# Patient Record
Sex: Male | Born: 1950 | Race: Black or African American | Hispanic: No | Smoking: Former smoker
Health system: Southern US, Community
[De-identification: ages and names within clinical notes are randomized; demographics above are authoritative.]

## PROBLEM LIST (undated history)

## (undated) DIAGNOSIS — N390 Urinary tract infection, site not specified: Secondary | ICD-10-CM

## (undated) DIAGNOSIS — R1310 Dysphagia, unspecified: Secondary | ICD-10-CM

## (undated) DIAGNOSIS — E875 Hyperkalemia: Secondary | ICD-10-CM

## (undated) DIAGNOSIS — J449 Chronic obstructive pulmonary disease, unspecified: Secondary | ICD-10-CM

## (undated) DIAGNOSIS — G8190 Hemiplegia, unspecified affecting unspecified side: Secondary | ICD-10-CM

## (undated) DIAGNOSIS — I639 Cerebral infarction, unspecified: Secondary | ICD-10-CM

## (undated) DIAGNOSIS — M6281 Muscle weakness (generalized): Secondary | ICD-10-CM

## (undated) DIAGNOSIS — Z931 Gastrostomy status: Secondary | ICD-10-CM

## (undated) DIAGNOSIS — F0390 Unspecified dementia without behavioral disturbance: Secondary | ICD-10-CM

## (undated) HISTORY — PX: PEG TUBE PLACEMENT: SUR1034

---

## 2007-04-07 ENCOUNTER — Ambulatory Visit: Payer: Self-pay | Admitting: *Deleted

## 2007-09-21 ENCOUNTER — Ambulatory Visit: Payer: Self-pay | Admitting: Unknown Physician Specialty

## 2008-07-14 ENCOUNTER — Ambulatory Visit: Payer: Self-pay | Admitting: Internal Medicine

## 2009-08-02 ENCOUNTER — Emergency Department (HOSPITAL_COMMUNITY): Admission: EM | Admit: 2009-08-02 | Discharge: 2009-08-02 | Payer: Self-pay | Admitting: Emergency Medicine

## 2011-01-13 ENCOUNTER — Encounter: Payer: Self-pay | Admitting: Neurology

## 2011-02-24 ENCOUNTER — Inpatient Hospital Stay (HOSPITAL_COMMUNITY)
Admission: EM | Admit: 2011-02-24 | Discharge: 2011-02-27 | DRG: 638 | Disposition: A | Discharge status: Skilled Nursing Facility | Payer: Medicare Other | Attending: Internal Medicine | Admitting: Internal Medicine

## 2011-02-24 ENCOUNTER — Emergency Department (HOSPITAL_COMMUNITY): Payer: Medicare Other

## 2011-02-24 ENCOUNTER — Inpatient Hospital Stay (HOSPITAL_COMMUNITY): Payer: Medicare Other

## 2011-02-24 DIAGNOSIS — N390 Urinary tract infection, site not specified: Secondary | ICD-10-CM | POA: Diagnosis present

## 2011-02-24 DIAGNOSIS — E872 Acidosis, unspecified: Secondary | ICD-10-CM | POA: Diagnosis present

## 2011-02-24 DIAGNOSIS — D509 Iron deficiency anemia, unspecified: Secondary | ICD-10-CM | POA: Diagnosis present

## 2011-02-24 DIAGNOSIS — I6529 Occlusion and stenosis of unspecified carotid artery: Secondary | ICD-10-CM | POA: Diagnosis present

## 2011-02-24 DIAGNOSIS — I69959 Hemiplegia and hemiparesis following unspecified cerebrovascular disease affecting unspecified side: Secondary | ICD-10-CM

## 2011-02-24 DIAGNOSIS — E870 Hyperosmolality and hypernatremia: Secondary | ICD-10-CM | POA: Diagnosis present

## 2011-02-24 DIAGNOSIS — N179 Acute kidney failure, unspecified: Secondary | ICD-10-CM | POA: Diagnosis present

## 2011-02-24 DIAGNOSIS — IMO0001 Reserved for inherently not codable concepts without codable children: Principal | ICD-10-CM | POA: Diagnosis present

## 2011-02-24 DIAGNOSIS — Z9689 Presence of other specified functional implants: Secondary | ICD-10-CM

## 2011-02-24 DIAGNOSIS — E785 Hyperlipidemia, unspecified: Secondary | ICD-10-CM | POA: Diagnosis present

## 2011-02-24 DIAGNOSIS — I9589 Other hypotension: Secondary | ICD-10-CM | POA: Diagnosis present

## 2011-02-24 DIAGNOSIS — B9689 Other specified bacterial agents as the cause of diseases classified elsewhere: Secondary | ICD-10-CM | POA: Diagnosis present

## 2011-02-24 DIAGNOSIS — E876 Hypokalemia: Secondary | ICD-10-CM | POA: Diagnosis present

## 2011-02-24 DIAGNOSIS — E861 Hypovolemia: Secondary | ICD-10-CM | POA: Diagnosis present

## 2011-02-24 DIAGNOSIS — I951 Orthostatic hypotension: Secondary | ICD-10-CM | POA: Diagnosis present

## 2011-02-24 LAB — URINE MICROSCOPIC-ADD ON

## 2011-02-24 LAB — COMPREHENSIVE METABOLIC PANEL
ALT: 13 U/L (ref 0–53)
AST: 12 U/L (ref 0–37)
Albumin: 1.4 g/dL — ABNORMAL LOW (ref 3.5–5.2)
CO2: 18 mEq/L — ABNORMAL LOW (ref 19–32)
Calcium: 6 mg/dL — CL (ref 8.4–10.5)
Creatinine, Ser: 0.91 mg/dL (ref 0.4–1.5)
GFR calc Af Amer: >60 mL/min (ref >60)
GFR calc non Af Amer: >60 mL/min (ref >60)
Sodium: 148 mEq/L — ABNORMAL HIGH (ref 135–145)
Total Protein: 3.8 g/dL — ABNORMAL LOW (ref 6.0–8.3)

## 2011-02-24 LAB — DIFFERENTIAL
Basophils Absolute: 0 10*3/uL (ref 0.0–0.1)
Basophils Relative: 0 % (ref 0–1)
Neutro Abs: 8.2 10*3/uL — ABNORMAL HIGH (ref 1.7–7.7)
Neutrophils Relative %: 76 % (ref 43–77)

## 2011-02-24 LAB — BLOOD GAS, ARTERIAL
Acid-base deficit: 0.5 mmol/L (ref 0.0–2.0)
Bicarbonate: 23.9 mEq/L (ref 20.0–24.0)
TCO2: 22.3 mmol/L (ref 0–100)
pCO2 arterial: 41.1 mmHg (ref 35.0–45.0)
pH, Arterial: 7.382 (ref 7.350–7.450)
pO2, Arterial: 69.6 mmHg — ABNORMAL LOW (ref 80.0–100.0)

## 2011-02-24 LAB — PROTIME-INR: Prothrombin Time: 16.9 seconds — ABNORMAL HIGH (ref 11.6–15.2)

## 2011-02-24 LAB — URINALYSIS, ROUTINE W REFLEX MICROSCOPIC
Glucose, UA: >1000 mg/dL — AB
Ketones, ur: 15 mg/dL — AB
Leukocytes, UA: NEGATIVE
Nitrite: POSITIVE — AB
pH: 5 (ref 5.0–8.0)

## 2011-02-24 LAB — CBC
HCT: 29.8 % — ABNORMAL LOW (ref 39.0–52.0)
MCH: 28.3 pg (ref 26.0–34.0)
MCHC: 32.9 g/dL (ref 30.0–36.0)
MCV: 86.1 fL (ref 78.0–100.0)
RBC: 3.46 MIL/uL — ABNORMAL LOW (ref 4.22–5.81)
WBC: 10.7 10*3/uL — ABNORMAL HIGH (ref 4.0–10.5)

## 2011-02-24 LAB — GLUCOSE, CAPILLARY
Glucose-Capillary: 510 mg/dL — ABNORMAL HIGH (ref 70–99)
Glucose-Capillary: 263 mg/dL — ABNORMAL HIGH (ref 70–99)

## 2011-02-24 LAB — POCT CARDIAC MARKERS
Myoglobin, poc: 487 ng/mL (ref 12–200)
Troponin i, poc: <0.05 ng/mL (ref 0.00–0.09)

## 2011-02-25 ENCOUNTER — Inpatient Hospital Stay (HOSPITAL_COMMUNITY): Payer: Medicare Other

## 2011-02-25 DIAGNOSIS — R55 Syncope and collapse: Secondary | ICD-10-CM

## 2011-02-25 LAB — COMPREHENSIVE METABOLIC PANEL
ALT: 18 U/L (ref 0–53)
AST: 17 U/L (ref 0–37)
Albumin: 2.1 g/dL — ABNORMAL LOW (ref 3.5–5.2)
Alkaline Phosphatase: 100 U/L (ref 39–117)
Chloride: 112 mEq/L (ref 96–112)
GFR calc Af Amer: >60 mL/min (ref >60)
Potassium: 3.9 mEq/L (ref 3.5–5.1)
Sodium: 145 mEq/L (ref 135–145)
Total Bilirubin: 0.7 mg/dL (ref 0.3–1.2)

## 2011-02-25 LAB — GLUCOSE, CAPILLARY
Glucose-Capillary: 255 mg/dL — ABNORMAL HIGH (ref 70–99)
Glucose-Capillary: 125 mg/dL — ABNORMAL HIGH (ref 70–99)
Glucose-Capillary: 226 mg/dL — ABNORMAL HIGH (ref 70–99)

## 2011-02-25 LAB — CBC
MCV: 86.7 fL (ref 78.0–100.0)
Platelets: 255 10*3/uL (ref 150–400)
RBC: 4.07 MIL/uL — ABNORMAL LOW (ref 4.22–5.81)
WBC: 14.2 10*3/uL — ABNORMAL HIGH (ref 4.0–10.5)

## 2011-02-25 LAB — CARDIAC PANEL(CRET KIN+CKTOT+MB+TROPI)
CK, MB: 4.3 ng/mL — ABNORMAL HIGH (ref 0.3–4.0)
Relative Index: 1.9 (ref 0.0–2.5)
Total CK: 203 U/L (ref 7–232)
Troponin I: 0.01 ng/mL (ref 0.00–0.06)

## 2011-02-25 LAB — DIFFERENTIAL
Basophils Relative: 0 % (ref 0–1)
Eosinophils Absolute: 0 10*3/uL (ref 0.0–0.7)
Lymphs Abs: 3.2 10*3/uL (ref 0.7–4.0)
Neutrophils Relative %: 65 % (ref 43–77)

## 2011-02-25 LAB — HEMOGLOBIN A1C
Hgb A1c MFr Bld: 14.7 % — ABNORMAL HIGH (ref <5.7)
Mean Plasma Glucose: 375 mg/dL — ABNORMAL HIGH (ref <117)

## 2011-02-25 NOTE — H&P (Signed)
NAMEWILFRID, [name]                 ACCOUNT NO.:  [id number]  MEDICAL RECORD NO.:  [id number]           PATIENT TYPE:  E  LOCATION:  [facility]                          FACILITY:  [facility]  PHYSICIAN:  Talmage Nap, MD  DATE OF BIRTH:  1951/06/13  DATE OF ADMISSION:  02/24/2011 DATE OF DISCHARGE:  LH                             HISTORY & PHYSICAL   PRIMARY CARE PHYSICIAN:  Dr. Lyndon Graham.  CARDIOLOGIST:  Mario Iba, MD  CHIEF COMPLAINT:  "I passed out using the bathroom."  The patient is not a very good historian.  Family member not available to give additional history.  The patient is a 60 year old African American male with history of diabetes mellitus, CVA with left hemiplegia, was seen by me in the emergency room with history of having passed out using the bathroom.  The patient, however, denies any premonitory symptoms prior to the onset of passing out.  He denied any history of chest pain.  He denied any history of shortness of breath.  He denied any nausea or vomiting.  He denied any systemic symptoms.  No fever, no chills or rigor.  He also denied any slurred speech.  He said he passed out transiently hitting his head on the floor and thereafter regained consciousness.  He denied any history of urinary or fecal incontinence. He was, however, brought to the emergency room by family members for evaluation.  PAST MEDICAL HISTORY:  Positive for diabetes mellitus and CVA.  PAST SURGICAL HISTORY:  Unknown.  PREADMISSION MEDICATIONS:  Include 1. Bisoprolol/hydrochlorothiazide 5/6.25 one p.o. daily. 2. Metformin 1000 mg p.o. daily. 3. Metoprolol tartrate 25 mg p.o. daily. 4  Simvastatin 40 mg p.o. daily.  ALLERGIES:  HE HAS NO KNOWN ALLERGIES.  SOCIAL HISTORY:  The patient smokes about a pack of cigarettes in 3 days and he has been doing that for the past 30 years.  Denies any history of alcohol use.  FAMILY HISTORY:  The patient is not aware of any systemic  illness  in the family.  REVIEW OF SYSTEMS:  The patient denies any history of headaches.  No blurred vision.  He denies any chest pain or shortness of breath.  No cough.  No abdominal discomfort.  No diarrhea or hematochezia.  No dysuria or hematuria.  No swelling of the lower extremity.  No intolerance to heat or cold, and no neuropsychiatric disorder.  PHYSICAL EXAMINATION:  GENERAL:  Middle-aged man, very dehydrated and confused, not in any respiratory distress. INITIAL VITAL SIGNS:  Blood pressure was 74/56 and after IV bolus of normal saline 115/69, pulse 96, respiratory rate 20, temperature is 98.6. HEENT:  Blind right eye, but left pupil is reactive to light. NECK:  No jugular venous distention.  No carotid bruit.  No lymphadenopathy. CHEST:  Clear to auscultation. HEART:  Sounds are 1 and 2. ABDOMEN:  Soft, nontender.  Liver, spleen, kidney are not palpable. Bowel sounds are positive. EXTREMITIES:  Showed no pedal edema. NEUROLOGIC:  Showed left hemiplegia. SKIN:  Showed markedly decreased turgor with abrasion on the face and forehead.  LABORATORY DATA:  Urine microscopy  done on the patient showed urine wbc 25-50, bacteria many, and urine microscopy was positive for urine nitrite, negative for leukocyte esterase.  Arterial blood gas showed pH of 7.38, pCO2 of 41, pO2 of 69.6 with a bicarb of 23.9.  Chemistry showed sodium of 148, potassium of 3.0, chloride of 124, bicarb is 18, glucose is 345, BUN is 18, creatinine 0.91.  LFTs normal.  Hematological indices showed WBC of 10.7, hemoglobin of 9.8, hematocrit of 29.8, MCV 86.12, platelet count of 216,000, neutrophils 76%, absolute granulocyte count is 8.6.  Capillary glucose is 510.  Coagulation profile showed PT 16.9, INR 1.35 and APTT of 51.  EKG showed normal sinus rhythm with a rate of 87 beats per minute and Q-wave.  No acute ST-wave change noted, and chest x-ray showed left retrocardiac opacification which  could reflect atelectasis or airspace disease.  There is bilateral peribronchial thickening which may represent changes of acute infection or chronic interstitial disease or may be smoking related and there is left 9th posterior rib fracture of uncertain chronicity.  First set of cardiac enzymes troponin-I less than 0.05.   CT of the brain not ordered.  ADMITTING IMPRESSION: 1. Syncope. 2. Nonketotic hyper-osmolar hyperglycemia. 3. Dehydration. 4. Urinary tract infection. 5. Hypokalemia. 6. Blind right eye. 7. Cerebrovascular accident with left hemiplegia. 8. Anemia  PLAN:  Plan is to admit the patient to telemetry.  The patient will be  rehydrated with normal saline with 20 mEq of KCl to go at rate of 250 mL an hour.  He will be on Accu-Cheks q.i.d. with a.c. h.s. with regular insulin sliding scale ( moderate scale).  Other medication to be given to the patient will include aspirin 81 mg p.o. daily, Zocor 20 mg p.o. daily, Rocephin 1 g IV q. 24 for the treatment of UTI, Protonix 40 mg IV q. 12 for GI prophylaxis, and Lovenox 40 mg subcu q. 24 for DVT prophylaxis. Further labs to be ordered on this patient will include cardiac enzymes q.6 x3, urine culture and blood culture before starting IV antibiotics, hemoglobin A1c, CBCD, CMP and magnesium will be repeated in a.m.  Imaging studies to be ordered will include CT brain without contrast, carotid duplex, 2-D echo and EEG to rule out seizures.  The patient will be reevaluated with lab and imaging studies and he will be followed on a daily basis.     Talmage Nap, MD     CN/MEDQ  D:  02/24/2011  T:  02/24/2011  Job:  [id number]  Electronically Signed by Talmage Nap  on 02/25/2011 12:49:49 AM

## 2011-02-26 LAB — DIFFERENTIAL
Basophils Absolute: 0 10*3/uL (ref 0.0–0.1)
Basophils Relative: 0 % (ref 0–1)
Eosinophils Absolute: 0.2 10*3/uL (ref 0.0–0.7)
Neutro Abs: 6.5 10*3/uL (ref 1.7–7.7)
Neutrophils Relative %: 60 % (ref 43–77)

## 2011-02-26 LAB — FOLATE: Folate: >20 ng/mL

## 2011-02-26 LAB — VITAMIN B12: Vitamin B-12: 1105 pg/mL — ABNORMAL HIGH (ref 211–911)

## 2011-02-26 LAB — GLUCOSE, CAPILLARY
Glucose-Capillary: 93 mg/dL (ref 70–99)
Glucose-Capillary: 354 mg/dL — ABNORMAL HIGH (ref 70–99)
Glucose-Capillary: 205 mg/dL — ABNORMAL HIGH (ref 70–99)

## 2011-02-26 LAB — BASIC METABOLIC PANEL
CO2: 22 mEq/L (ref 19–32)
Calcium: 8.3 mg/dL — ABNORMAL LOW (ref 8.4–10.5)
Chloride: 112 mEq/L (ref 96–112)
GFR calc Af Amer: >60 mL/min (ref >60)
Sodium: 138 mEq/L (ref 135–145)

## 2011-02-26 LAB — URINE CULTURE

## 2011-02-26 LAB — CBC
Hemoglobin: 10.7 g/dL — ABNORMAL LOW (ref 13.0–17.0)
MCHC: 33.2 g/dL (ref 30.0–36.0)
Platelets: 265 10*3/uL (ref 150–400)
RBC: 3.78 MIL/uL — ABNORMAL LOW (ref 4.22–5.81)

## 2011-02-26 LAB — IRON AND TIBC: UIBC: 159 ug/dL

## 2011-02-26 LAB — T4, FREE: Free T4: 0.91 ng/dL (ref 0.80–1.80)

## 2011-02-27 LAB — GLUCOSE, CAPILLARY: Glucose-Capillary: 93 mg/dL (ref 70–99)

## 2011-02-27 LAB — LIPID PANEL
HDL: 52 mg/dL (ref >39)
Total CHOL/HDL Ratio: 2.4 RATIO
Triglycerides: 89 mg/dL (ref <150)
VLDL: 18 mg/dL (ref 0–40)

## 2011-02-28 NOTE — Discharge Summary (Signed)
NAMECAROLOS, [name]                 ACCOUNT NO.:  [id number]  MEDICAL RECORD NO.:  [id number]           PATIENT TYPE:  I  LOCATION:  A214                          FACILITY:  [facility]  PHYSICIAN:  Elliot Cousin, M.D.    DATE OF BIRTH:  17-May-1951  DATE OF ADMISSION:  02/24/2011 DATE OF DISCHARGE:  03/07/2012LH                         DISCHARGE SUMMARY-REFERRING   DISCHARGE DIAGNOSES: 1. Syncope.  Etiology multifactorial including hypotension,     hypovolemia, urinary tract infection, and uncontrolled type 2     diabetes mellitus. 2. Klebsiella oxytoca urinary tract infection. 3. Hypotension and hypovolemia, resolved with IV fluids. 4. History of hypertension. 5. History of multiple strokes, most notably history of right brain     stroke with left-sided hemiparesis. 6. Right internal carotid artery stenosis per ultrasound. 7. Uncontrolled type 2 diabetes mellitus.  The patient's hemoglobin     A1c was 14.7. 8. Mild iron deficiency anemia.  The patient's anemia panel revealed a     total iron of 25, TIBC of 184, percent saturation of 14, vitamin     B12 of 1105, folate of greater than 20, and ferritin of 188. 9. Diastolic dysfunction per 2-D echocardiogram on February 25, 2011. 10.History of penile implant.  The patient will follow up with     urologist, Dr. Jerre Simon, to have it removed electively. 11.Metabolic acidosis, resolved. 12.Chronic maxillary sinusitis per CT scan of the head. 13.Dislocated right lens per CT scan of the head. 14.History of coronary artery disease. 15.Hyperlipidemia.  The patient's fasting lipid profile revealed a     total cholesterol of 127, triglycerides of 89, HDL cholesterol of     52, and LDL cholesterol of 57. 16.Hypernatremia. 17.Hypokalemia.  MEDICATIONS: 1. Ceftin 500 mg b.i.d. for 7 more days. 2. Plavix 75 mg daily (new medication). 3. Glipizide 5 mg half a tablet twice daily (new medication). 4. Metformin 500 mg b.i.d. 5. Lantus insulin 10 units  subcu b.i.d. (new medication). 6. Sliding scale NovoLog sensitive scale before breakfast, lunch, and     dinner (new medication). 7. Lopressor 25 mg b.i.d. 8. Simvastatin 40 mg q.h.s. 9. Multivitamin with iron once daily. 10.Tylenol 650 mg p.o. every 4 hours p.r.n. pain and fever.  DISCHARGE DISPOSITION:  The patient was discharged to [facility] in improved and stable condition on February 27, 2011.  CONSULTATIONS:  Redge Gainer, MD  PROCEDURES PERFORMED: 1. Carotid ultrasound on February 25, 2011.  The results revealed right     internal carotid artery is occluded.  The left carotid artery has     mural thickening, small plaques, but no evidence of left internal     carotid artery stenosis. 2. The 2-D echocardiogram on February 25, 2011.  The results revealed     left ventricular size is normal.  Wall thickness was increased in     the pattern of mild LVH.  Systolic function was normal.  Ejection     fraction was in the range of 50-55%.  Probable hypokinesis and     scarring of the basal mid inferior septal myocardium.  Grade 1  diastolic dysfunction.  Trivial regurgitation of the mitral valve. 3. CT scan of the head on February 25, 2011.  The results revealed     extensive remote/chronic ischemic changes bilaterally.  Right     greater than left ischemic changes.  History of prior right MCA     territory stroke.  No definite evidence of acute intracranial     abnormality.  The lens of the right orbit appears inferiorly and     posteriorly dislocated.  Chronic left maxillary sinusitis.  HISTORY OF PRESENT ILLNESS:  The patient is a 60 year old man with a past medical history significant for previous strokes, left-sided hemiparesis, type 2 diabetes mellitus, and coronary artery disease.  He presented to the emergency department on February 24, 2011, after passing out at home.  According to his friend and caretaker, Ms. Jonathon Resides, the patient was  being bathed.  During the bath, he slumped over. Ms. Donnetta Hail son was able to lift the patient onto the toilet.  He was unconscious for a few seconds before becoming more alert.  During the initial evaluation, the patient was noted to be hypotensive with a blood pressure of 74/56.  He was afebrile.  His heart rate was within normal limits.  His EKG revealed normal sinus rhythm with no ST or T-wave abnormalities and a heart rate of 87 beats per minute.  The CT scan of his head revealed chronic ischemic changes and  previous infarcts.  His chest x-ray revealed bilateral peribronchial thickening and left retrocardiac opacity either atelectasis or airspace disease. It also revealed left 9th posterior rib fracture of uncertain chronicity.  He was admitted for further evaluation and management.  HOSPITAL COURSE: 1. SYNCOPE.  The patient was aggressively hydrated in the emergency     department.  IV fluid volume repletion continued with normal saline     with potassium chloride added.  His urinalysis was consistent with     infection and therefore Rocephin was started at 1 g IV daily.  For     further evaluation, a number of studies were ordered including a CT     scan of the head, cardiac enzymes, carotid Doppler duplex, 2-D     echocardiogram, and thyroid function panel.  The CT scan of the     head revealed chronic ischemic changes and old infarcts primarily     on the right consistent with his history of right brain stroke and     left-sided hemiparesis.  The carotid ultrasound revealed right ICA     occlusion which was most likely remote and likely the potential     cause of his right brain stroke in the past.  His 2-D     echocardiogram revealed preserved LV function, although there was     evidence of grade 1 diastolic dysfunction and scarring of the basal     mid inferior septal myocardium.  The patient did relate that he had     a history of a heart attack.  All of his cardiac enzymes  were     within normal limits and therefore he ruled out for myocardial     infarction.  His TSH was within normal limits at 1.3 and his free     T4 was within normal limits at 0.91.  An ABG was ordered as well     and it revealed a pH of 7.38, pCO2 of 41, and pO2 of 70.  Following     supportive treatment, the patient improved  clinically.  There was     no evidence of syncope or worsening neurological findings based on     the history I obtained from his family.  He was evaluated by the     physical therapist.  She recommended skilled nursing facility     placement for weakness and his decreased ability to transfer.  His     family was in agreement.  Therefore, the patient was discharged to     a skilled nursing facility in [place].      Of note, due to the extensiveness of his cerebrovascular disease,     aspirin was discontinued and Plavix was started.  2. DEHYDRATION, ACUTE RENAL FAILURE, HYPERNATREMIA, AND HYPOKALEMIA.     The patient's BUN was 18, his creatinine was 0.91, his sodium was     148, and his potassium was 3.0 on admission.  His CO2 was 18.  He     was repleted with normal saline with potassium chloride added.  He     was also given potassium chloride supplementation orally.  A blood     magnesium level was assessed and it was within normal limits at     1.8.  At the time of hospital discharge, his serum sodium improved     to 138, his potassium improved to 4.1, and his CO2 improved to 22. 3. UNCONTROLLED TYPE 2 DIABETES MELLITUS.  The patient's venous     glucose was 345 on admission.  He was started on sliding scale     NovoLog and Lantus insulin.  Metformin was held temporarily.  The     Lantus and sliding scale NovoLog were both titrated upward due to     uncontrolled blood sugars.  Upon discharge, metformin was     restarted.  Glipizide was added at 5 mg daily.  Although he had not     been treated with insulin in the past, he was discharged home on      Lantus and sliding scale NovoLog.  His hemoglobin A1c was noted to     be 14.7, indicating very poor control. 4. HISTORY OF HYPERTENSION.  Metoprolol, bisoprolol, and     hydrochlorothiazide were all discontinued secondary to hypotension.     Following volume repletion, his blood pressure increased.     Therefore, metoprolol was restarted at 25 mg b.i.d.  Bisoprolol and     hydrochlorothiazide were discontinued. 5. Hyperlipidemia.  The patient was maintained on Zocor.  The results     of his fasting lipid panel were dictated above. 6. NORMOCYTIC ANEMIA.  The patient's hemoglobin ranged from 9.8 to     11.8.  An anemia panel was ordered.  The results were dictated     above.  He was discharged to the nursing facility on a     multivitamin with iron. 7. URINARY TRACT INFECTION.  The patient was started on Rocephin     empirically.  A urine culture was ordered.  It grew out greater     than 100,000 colonies of Klebsiella, sensitive to Rocephin.  He     received a total of 3 doses.  He was discharged to the skilled     nursing facility on 7 more days of Ceftin. 8. History of penile implant.  The patient desired having the penile     implant which was inserted in 1998, removed   Dr. Jerre Simon     was consulted.  The plan is for Dr. Jerre Simon to  try to obtain     surgical records from the hospital in which the patient had the     implant inserted.  He will follow up with him in 2 weeks in his     office.  The patient should follow up with Dr. Jerre Simon on March     20th, Tuesday, at 3 p.m.  SUBJECTIVE:  The patient has no complaints of chest pain, shortness of breath, or headache.  OBJECTIVE:  VITAL SIGNS:  Temperature 98.8, pulse 80, respiratory rate 18, blood pressure 158/90, oxygen saturation 97% on room air, capillary blood glucose 243. LUNGS:  Clear to auscultation bilaterally. HEART:  S1-S2 with a soft systolic murmur. ABDOMEN:  Positive bowel sounds, soft, nontender, nondistended.   No hepatosplenomegaly.  No masses palpated. GU AND RECTAL:  Deferred. EXTREMITIES:  Pedal pulses are palpable bilaterally.  No pretibial edema and no pedal edema. SKIN:  There are multiple excoriated lesions on his arms and legs and his face.  Most are healed or scabbed over. NEUROLOGIC:  The patient is alert and oriented x3.  He does have chronic dysarthria.  He has 4-/5 strength of his left upper extremity and left lower extremity.  He has 5-/5 strength of his right upper and right lower extremity.  Both of his hands are clumsy.  He has decreased vision in his right eye.  He follows directions well.     Elliot Cousin, M.D.     DF/MEDQ  D:  02/27/2011  T:  02/27/2011  Job:  [id number]  cc:   Ky Barban, M.D. Fax: [phone]  Antonieta Iba, MD Fax: [phone]  Electronically Signed by Elliot Cousin M.D. on 02/27/2011 04:13:25 PM

## 2011-03-02 LAB — CULTURE, BLOOD (ROUTINE X 2): Culture: NO GROWTH

## 2011-07-01 ENCOUNTER — Inpatient Hospital Stay: Payer: Self-pay | Admitting: Internal Medicine

## 2013-04-23 ENCOUNTER — Other Ambulatory Visit: Payer: Self-pay | Admitting: Internal Medicine

## 2013-04-23 LAB — BASIC METABOLIC PANEL
Anion Gap: 4 — ABNORMAL LOW (ref 7–16)
Chloride: 110 mmol/L — ABNORMAL HIGH (ref 98–107)
Co2: 28 mmol/L (ref 21–32)
Osmolality: 291 (ref 275–301)
Potassium: 4.8 mmol/L (ref 3.5–5.1)
Sodium: 142 mmol/L (ref 136–145)

## 2014-01-07 ENCOUNTER — Inpatient Hospital Stay: Payer: Self-pay | Admitting: Internal Medicine

## 2014-01-07 LAB — COMPREHENSIVE METABOLIC PANEL
AST: 15 U/L (ref 15–37)
Albumin: 2.6 g/dL — ABNORMAL LOW (ref 3.4–5.0)
Alkaline Phosphatase: 149 U/L — ABNORMAL HIGH
Anion Gap: 2 — ABNORMAL LOW (ref 7–16)
BUN: 22 mg/dL — AB (ref 7–18)
Bilirubin,Total: 0.2 mg/dL (ref 0.2–1.0)
CALCIUM: 8.8 mg/dL (ref 8.5–10.1)
Chloride: 105 mmol/L (ref 98–107)
Co2: 28 mmol/L (ref 21–32)
Creatinine: 2.14 mg/dL — ABNORMAL HIGH (ref 0.60–1.30)
GFR CALC AF AMER: 37 — AB
GFR CALC NON AF AMER: 32 — AB
Glucose: 225 mg/dL — ABNORMAL HIGH (ref 65–99)
OSMOLALITY: 280 (ref 275–301)
POTASSIUM: 5.4 mmol/L — AB (ref 3.5–5.1)
SGPT (ALT): 14 U/L (ref 12–78)
Sodium: 135 mmol/L — ABNORMAL LOW (ref 136–145)
Total Protein: 7.6 g/dL (ref 6.4–8.2)

## 2014-01-07 LAB — CK TOTAL AND CKMB (NOT AT ARMC)
CK, Total: 269 U/L — ABNORMAL HIGH (ref 35–232)
CK-MB: 3.3 ng/mL (ref 0.5–3.6)

## 2014-01-07 LAB — TROPONIN I: Troponin-I: 0.02 ng/mL

## 2014-01-07 LAB — URINALYSIS, COMPLETE
BLOOD: NEGATIVE
Bacteria: NONE SEEN
Bilirubin,UR: NEGATIVE
Glucose,UR: >=500 mg/dL (ref 0–75)
Ketone: NEGATIVE
Leukocyte Esterase: NEGATIVE
Nitrite: NEGATIVE
PH: 7 (ref 4.5–8.0)
RBC,UR: 1 /HPF (ref 0–5)
Specific Gravity: 1.014 (ref 1.003–1.030)
Squamous Epithelial: NONE SEEN

## 2014-01-07 LAB — CBC
HCT: 39.4 % — ABNORMAL LOW (ref 40.0–52.0)
HGB: 12.9 g/dL — ABNORMAL LOW (ref 13.0–18.0)
MCH: 27.6 pg (ref 26.0–34.0)
MCHC: 32.8 g/dL (ref 32.0–36.0)
MCV: 84 fL (ref 80–100)
PLATELETS: 264 10*3/uL (ref 150–440)
RBC: 4.69 10*6/uL (ref 4.40–5.90)
RDW: 13.1 % (ref 11.5–14.5)
WBC: 15.1 10*3/uL — AB (ref 3.8–10.6)

## 2014-01-08 ENCOUNTER — Ambulatory Visit: Payer: Self-pay | Admitting: Neurology

## 2014-01-08 LAB — CBC WITH DIFFERENTIAL/PLATELET
BASOS PCT: 0.5 %
Basophil #: 0.1 10*3/uL (ref 0.0–0.1)
EOS ABS: 0 10*3/uL (ref 0.0–0.7)
EOS PCT: 0.2 %
HCT: 38 % — ABNORMAL LOW (ref 40.0–52.0)
HGB: 12.2 g/dL — ABNORMAL LOW (ref 13.0–18.0)
LYMPHS PCT: 22.2 %
Lymphocyte #: 2.6 10*3/uL (ref 1.0–3.6)
MCH: 27 pg (ref 26.0–34.0)
MCHC: 32.2 g/dL (ref 32.0–36.0)
MCV: 84 fL (ref 80–100)
Monocyte #: 1.1 x10 3/mm — ABNORMAL HIGH (ref 0.2–1.0)
Monocyte %: 9.7 %
NEUTROS PCT: 67.4 %
Neutrophil #: 8 10*3/uL — ABNORMAL HIGH (ref 1.4–6.5)
Platelet: 247 10*3/uL (ref 150–440)
RBC: 4.53 10*6/uL (ref 4.40–5.90)
RDW: 13.3 % (ref 11.5–14.5)
WBC: 11.8 10*3/uL — ABNORMAL HIGH (ref 3.8–10.6)

## 2014-01-08 LAB — BASIC METABOLIC PANEL
Anion Gap: 3 — ABNORMAL LOW (ref 7–16)
BUN: 20 mg/dL — ABNORMAL HIGH (ref 7–18)
CHLORIDE: 106 mmol/L (ref 98–107)
CO2: 28 mmol/L (ref 21–32)
Calcium, Total: 8.6 mg/dL (ref 8.5–10.1)
Creatinine: 2.14 mg/dL — ABNORMAL HIGH (ref 0.60–1.30)
EGFR (African American): 37 — ABNORMAL LOW
GFR CALC NON AF AMER: 32 — AB
Glucose: 206 mg/dL — ABNORMAL HIGH (ref 65–99)
OSMOLALITY: 282 (ref 275–301)
Potassium: 4.3 mmol/L (ref 3.5–5.1)
Sodium: 137 mmol/L (ref 136–145)

## 2014-01-08 LAB — HEMOGLOBIN A1C: Hemoglobin A1C: 8.9 % — ABNORMAL HIGH (ref 4.2–6.3)

## 2014-01-08 LAB — PHENYTOIN LEVEL, TOTAL: Dilantin: 14.7 ug/mL (ref 10.0–20.0)

## 2014-01-08 LAB — TSH: THYROID STIMULATING HORM: 3.61 u[IU]/mL

## 2014-01-08 LAB — MAGNESIUM: Magnesium: 2 mg/dL

## 2014-01-09 LAB — CBC WITH DIFFERENTIAL/PLATELET
Basophil #: 0.1 10*3/uL (ref 0.0–0.1)
Basophil %: 1.1 %
Eosinophil #: 0.1 10*3/uL (ref 0.0–0.7)
Eosinophil %: 1.2 %
HCT: 36.3 % — AB (ref 40.0–52.0)
HGB: 11.9 g/dL — AB (ref 13.0–18.0)
LYMPHS PCT: 30.8 %
Lymphocyte #: 2.7 10*3/uL (ref 1.0–3.6)
MCH: 27.4 pg (ref 26.0–34.0)
MCHC: 32.7 g/dL (ref 32.0–36.0)
MCV: 84 fL (ref 80–100)
MONO ABS: 1.1 x10 3/mm — AB (ref 0.2–1.0)
MONOS PCT: 12.5 %
NEUTROS PCT: 54.4 %
Neutrophil #: 4.8 10*3/uL (ref 1.4–6.5)
Platelet: 245 10*3/uL (ref 150–440)
RBC: 4.34 10*6/uL — ABNORMAL LOW (ref 4.40–5.90)
RDW: 13.2 % (ref 11.5–14.5)
WBC: 8.9 10*3/uL (ref 3.8–10.6)

## 2014-01-09 LAB — BASIC METABOLIC PANEL
ANION GAP: 3 — AB (ref 7–16)
BUN: 18 mg/dL (ref 7–18)
CREATININE: 2.2 mg/dL — AB (ref 0.60–1.30)
Calcium, Total: 8.4 mg/dL — ABNORMAL LOW (ref 8.5–10.1)
Chloride: 109 mmol/L — ABNORMAL HIGH (ref 98–107)
Co2: 26 mmol/L (ref 21–32)
EGFR (Non-African Amer.): 31 — ABNORMAL LOW
GFR CALC AF AMER: 36 — AB
Glucose: 120 mg/dL — ABNORMAL HIGH (ref 65–99)
OSMOLALITY: 279 (ref 275–301)
POTASSIUM: 4.2 mmol/L (ref 3.5–5.1)
SODIUM: 138 mmol/L (ref 136–145)

## 2014-01-09 LAB — PHENYTOIN LEVEL, TOTAL: DILANTIN: 14.6 ug/mL (ref 10.0–20.0)

## 2014-01-10 LAB — PHENYTOIN LEVEL, TOTAL: Dilantin: 12.5 ug/mL (ref 10.0–20.0)

## 2014-01-10 LAB — BASIC METABOLIC PANEL
ANION GAP: 7 (ref 7–16)
BUN: 19 mg/dL — AB (ref 7–18)
CHLORIDE: 111 mmol/L — AB (ref 98–107)
Calcium, Total: 8.7 mg/dL (ref 8.5–10.1)
Co2: 24 mmol/L (ref 21–32)
Creatinine: 2.11 mg/dL — ABNORMAL HIGH (ref 0.60–1.30)
EGFR (African American): 38 — ABNORMAL LOW
EGFR (Non-African Amer.): 33 — ABNORMAL LOW
Glucose: 83 mg/dL (ref 65–99)
Osmolality: 285 (ref 275–301)
POTASSIUM: 4.1 mmol/L (ref 3.5–5.1)
Sodium: 142 mmol/L (ref 136–145)

## 2014-01-12 LAB — CULTURE, BLOOD (SINGLE)

## 2014-01-13 LAB — CLOSTRIDIUM DIFFICILE(ARMC)

## 2014-01-13 LAB — PLATELET COUNT: Platelet: 246 10*3/uL (ref 150–440)

## 2014-01-16 ENCOUNTER — Ambulatory Visit: Payer: Self-pay | Admitting: Neurology

## 2014-01-16 LAB — CREATININE, SERUM
Creatinine: 2.12 mg/dL — ABNORMAL HIGH (ref 0.60–1.30)
EGFR (African American): 38 — ABNORMAL LOW
GFR CALC NON AF AMER: 33 — AB

## 2014-01-17 LAB — CBC WITH DIFFERENTIAL/PLATELET
Basophil #: 0.1 10*3/uL (ref 0.0–0.1)
Basophil %: 1 %
EOS ABS: 0.4 10*3/uL (ref 0.0–0.7)
Eosinophil %: 3.9 %
HCT: 36.6 % — AB (ref 40.0–52.0)
HGB: 11.9 g/dL — ABNORMAL LOW (ref 13.0–18.0)
LYMPHS ABS: 3.5 10*3/uL (ref 1.0–3.6)
LYMPHS PCT: 36.6 %
MCH: 27.7 pg (ref 26.0–34.0)
MCHC: 32.5 g/dL (ref 32.0–36.0)
MCV: 85 fL (ref 80–100)
MONO ABS: 1 x10 3/mm (ref 0.2–1.0)
Monocyte %: 10.2 %
Neutrophil #: 4.6 10*3/uL (ref 1.4–6.5)
Neutrophil %: 48.3 %
PLATELETS: 318 10*3/uL (ref 150–440)
RBC: 4.3 10*6/uL — AB (ref 4.40–5.90)
RDW: 13.7 % (ref 11.5–14.5)
WBC: 9.5 10*3/uL (ref 3.8–10.6)

## 2014-01-17 LAB — BASIC METABOLIC PANEL
Anion Gap: 4 — ABNORMAL LOW (ref 7–16)
BUN: 15 mg/dL (ref 7–18)
CREATININE: 1.99 mg/dL — AB (ref 0.60–1.30)
Calcium, Total: 8.8 mg/dL (ref 8.5–10.1)
Chloride: 112 mmol/L — ABNORMAL HIGH (ref 98–107)
Co2: 25 mmol/L (ref 21–32)
EGFR (Non-African Amer.): 35 — ABNORMAL LOW
GFR CALC AF AMER: 41 — AB
Glucose: 94 mg/dL (ref 65–99)
Osmolality: 282 (ref 275–301)
Potassium: 4.1 mmol/L (ref 3.5–5.1)
Sodium: 141 mmol/L (ref 136–145)

## 2014-01-19 LAB — PHOSPHORUS: Phosphorus: 1.9 mg/dL — ABNORMAL LOW (ref 2.5–4.9)

## 2014-01-19 LAB — MAGNESIUM: Magnesium: 2 mg/dL

## 2014-03-23 ENCOUNTER — Ambulatory Visit: Payer: Self-pay | Admitting: Nurse Practitioner

## 2014-04-22 ENCOUNTER — Ambulatory Visit: Payer: Self-pay | Admitting: Nurse Practitioner

## 2014-05-12 ENCOUNTER — Ambulatory Visit: Payer: Self-pay | Admitting: Unknown Physician Specialty

## 2014-05-23 ENCOUNTER — Ambulatory Visit
Admit: 2014-05-23 | Disposition: A | Discharge status: Home / Self Care | Payer: Self-pay | Attending: Nurse Practitioner | Admitting: Nurse Practitioner

## 2014-06-22 ENCOUNTER — Ambulatory Visit
Admit: 2014-06-22 | Disposition: A | Discharge status: Home / Self Care | Payer: Self-pay | Attending: Nurse Practitioner | Admitting: Nurse Practitioner

## 2014-09-28 LAB — CBC WITH DIFFERENTIAL/PLATELET
BASOS ABS: 0 10*3/uL (ref 0.0–0.1)
BASOS PCT: 0.6 %
EOS ABS: 0.3 10*3/uL (ref 0.0–0.7)
EOS PCT: 5 %
HCT: 39.2 % — ABNORMAL LOW (ref 40.0–52.0)
HGB: 12.6 g/dL — ABNORMAL LOW (ref 13.0–18.0)
LYMPHS ABS: 1.8 10*3/uL (ref 1.0–3.6)
LYMPHS PCT: 30.6 %
MCH: 27.8 pg (ref 26.0–34.0)
MCHC: 32.1 g/dL (ref 32.0–36.0)
MCV: 87 fL (ref 80–100)
MONOS PCT: 11.2 %
Monocyte #: 0.7 x10 3/mm (ref 0.2–1.0)
NEUTROS ABS: 3.2 10*3/uL (ref 1.4–6.5)
Neutrophil %: 52.6 %
Platelet: 194 10*3/uL (ref 150–440)
RBC: 4.52 10*6/uL (ref 4.40–5.90)
RDW: 15.5 % — AB (ref 11.5–14.5)
WBC: 6 10*3/uL (ref 3.8–10.6)

## 2014-09-28 LAB — COMPREHENSIVE METABOLIC PANEL
ALT: 59 U/L
AST: 50 U/L — AB (ref 15–37)
Albumin: 2.5 g/dL — ABNORMAL LOW (ref 3.4–5.0)
Alkaline Phosphatase: 135 U/L — ABNORMAL HIGH
Anion Gap: 2 — ABNORMAL LOW (ref 7–16)
BILIRUBIN TOTAL: 0.1 mg/dL — AB (ref 0.2–1.0)
BUN: 46 mg/dL — ABNORMAL HIGH (ref 7–18)
CALCIUM: 8.6 mg/dL (ref 8.5–10.1)
Chloride: 112 mmol/L — ABNORMAL HIGH (ref 98–107)
Co2: 31 mmol/L (ref 21–32)
Creatinine: 1.8 mg/dL — ABNORMAL HIGH (ref 0.60–1.30)
EGFR (Non-African Amer.): 41 — ABNORMAL LOW
GFR CALC AF AMER: 50 — AB
Glucose: 89 mg/dL (ref 65–99)
OSMOLALITY: 300 (ref 275–301)
Potassium: 4.4 mmol/L (ref 3.5–5.1)
SODIUM: 145 mmol/L (ref 136–145)
TOTAL PROTEIN: 6.9 g/dL (ref 6.4–8.2)

## 2014-09-28 LAB — VALPROIC ACID LEVEL: Valproic Acid: 22 ug/mL — ABNORMAL LOW

## 2014-09-29 ENCOUNTER — Inpatient Hospital Stay: Payer: Self-pay | Admitting: Internal Medicine

## 2014-09-29 LAB — CBC WITH DIFFERENTIAL/PLATELET
BASOS ABS: 0 10*3/uL (ref 0.0–0.1)
BASOS PCT: 0.6 %
EOS PCT: 3.8 %
Eosinophil #: 0.2 10*3/uL (ref 0.0–0.7)
HCT: 38.6 % — AB (ref 40.0–52.0)
HGB: 12.2 g/dL — ABNORMAL LOW (ref 13.0–18.0)
LYMPHS PCT: 32.9 %
Lymphocyte #: 2 10*3/uL (ref 1.0–3.6)
MCH: 27.8 pg (ref 26.0–34.0)
MCHC: 31.6 g/dL — AB (ref 32.0–36.0)
MCV: 88 fL (ref 80–100)
MONOS PCT: 12.9 %
Monocyte #: 0.8 x10 3/mm (ref 0.2–1.0)
NEUTROS PCT: 49.8 %
Neutrophil #: 3.1 10*3/uL (ref 1.4–6.5)
PLATELETS: 186 10*3/uL (ref 150–440)
RBC: 4.39 10*6/uL — ABNORMAL LOW (ref 4.40–5.90)
RDW: 15.7 % — ABNORMAL HIGH (ref 11.5–14.5)
WBC: 6.2 10*3/uL (ref 3.8–10.6)

## 2014-09-29 LAB — BASIC METABOLIC PANEL
Anion Gap: 5 — ABNORMAL LOW (ref 7–16)
BUN: 34 mg/dL — ABNORMAL HIGH (ref 7–18)
CHLORIDE: 115 mmol/L — AB (ref 98–107)
CREATININE: 1.55 mg/dL — AB (ref 0.60–1.30)
Calcium, Total: 8.2 mg/dL — ABNORMAL LOW (ref 8.5–10.1)
Co2: 30 mmol/L (ref 21–32)
EGFR (African American): 59 — ABNORMAL LOW
GFR CALC NON AF AMER: 49 — AB
GLUCOSE: 81 mg/dL (ref 65–99)
Osmolality: 305 (ref 275–301)
Potassium: 4.3 mmol/L (ref 3.5–5.1)
SODIUM: 150 mmol/L — AB (ref 136–145)

## 2014-09-30 LAB — BASIC METABOLIC PANEL
ANION GAP: 5 — AB (ref 7–16)
BUN: 26 mg/dL — ABNORMAL HIGH (ref 7–18)
CO2: 25 mmol/L (ref 21–32)
CREATININE: 1.45 mg/dL — AB (ref 0.60–1.30)
Calcium, Total: 8.4 mg/dL — ABNORMAL LOW (ref 8.5–10.1)
Chloride: 116 mmol/L — ABNORMAL HIGH (ref 98–107)
GFR CALC NON AF AMER: 53 — AB
Glucose: 116 mg/dL — ABNORMAL HIGH (ref 65–99)
Osmolality: 296 (ref 275–301)
Potassium: 4.5 mmol/L (ref 3.5–5.1)
SODIUM: 146 mmol/L — AB (ref 136–145)

## 2014-10-01 LAB — BASIC METABOLIC PANEL
ANION GAP: 7 (ref 7–16)
BUN: 29 mg/dL — ABNORMAL HIGH (ref 7–18)
CALCIUM: 8.1 mg/dL — AB (ref 8.5–10.1)
Chloride: 115 mmol/L — ABNORMAL HIGH (ref 98–107)
Co2: 25 mmol/L (ref 21–32)
Creatinine: 1.72 mg/dL — ABNORMAL HIGH (ref 0.60–1.30)
EGFR (African American): 52 — ABNORMAL LOW
EGFR (Non-African Amer.): 43 — ABNORMAL LOW
GLUCOSE: 110 mg/dL — AB (ref 65–99)
Osmolality: 299 (ref 275–301)
POTASSIUM: 4 mmol/L (ref 3.5–5.1)
Sodium: 147 mmol/L — ABNORMAL HIGH (ref 136–145)

## 2014-10-02 LAB — BASIC METABOLIC PANEL
Anion Gap: 6 — ABNORMAL LOW (ref 7–16)
BUN: 30 mg/dL — ABNORMAL HIGH (ref 7–18)
CALCIUM: 7.9 mg/dL — AB (ref 8.5–10.1)
Chloride: 115 mmol/L — ABNORMAL HIGH (ref 98–107)
Co2: 26 mmol/L (ref 21–32)
Creatinine: 1.68 mg/dL — ABNORMAL HIGH (ref 0.60–1.30)
EGFR (African American): 54 — ABNORMAL LOW
EGFR (Non-African Amer.): 44 — ABNORMAL LOW
GLUCOSE: 138 mg/dL — AB (ref 65–99)
OSMOLALITY: 301 (ref 275–301)
Potassium: 4.3 mmol/L (ref 3.5–5.1)
Sodium: 147 mmol/L — ABNORMAL HIGH (ref 136–145)

## 2014-10-03 LAB — BASIC METABOLIC PANEL
Anion Gap: 5 — ABNORMAL LOW (ref 7–16)
BUN: 27 mg/dL — AB (ref 7–18)
Calcium, Total: 8.2 mg/dL — ABNORMAL LOW (ref 8.5–10.1)
Chloride: 114 mmol/L — ABNORMAL HIGH (ref 98–107)
Co2: 27 mmol/L (ref 21–32)
Creatinine: 1.66 mg/dL — ABNORMAL HIGH (ref 0.60–1.30)
GFR CALC AF AMER: 55 — AB
GFR CALC NON AF AMER: 45 — AB
Glucose: 93 mg/dL (ref 65–99)
OSMOLALITY: 295 (ref 275–301)
Potassium: 4.1 mmol/L (ref 3.5–5.1)
Sodium: 146 mmol/L — ABNORMAL HIGH (ref 136–145)

## 2014-10-04 LAB — BASIC METABOLIC PANEL
ANION GAP: 6 — AB (ref 7–16)
BUN: 22 mg/dL — ABNORMAL HIGH (ref 7–18)
CHLORIDE: 112 mmol/L — AB (ref 98–107)
Calcium, Total: 7.9 mg/dL — ABNORMAL LOW (ref 8.5–10.1)
Co2: 26 mmol/L (ref 21–32)
Creatinine: 1.67 mg/dL — ABNORMAL HIGH (ref 0.60–1.30)
Glucose: 109 mg/dL — ABNORMAL HIGH (ref 65–99)
Osmolality: 291 (ref 275–301)
Potassium: 4.1 mmol/L (ref 3.5–5.1)
SODIUM: 144 mmol/L (ref 136–145)

## 2014-11-22 ENCOUNTER — Ambulatory Visit
Admit: 2014-11-22 | Disposition: A | Discharge status: Home / Self Care | Payer: Self-pay | Attending: Nurse Practitioner | Admitting: Nurse Practitioner

## 2015-04-12 ENCOUNTER — Inpatient Hospital Stay
Admit: 2015-04-12 | Disposition: A | Discharge status: Other Acute Inpatient Hospital | Payer: Self-pay | Attending: Internal Medicine | Admitting: Internal Medicine

## 2015-04-12 DIAGNOSIS — I361 Nonrheumatic tricuspid (valve) insufficiency: Secondary | ICD-10-CM

## 2015-04-12 LAB — URINALYSIS, COMPLETE
BILIRUBIN, UR: NEGATIVE
Glucose,UR: 50 mg/dL (ref 0–75)
KETONE: NEGATIVE
Leukocyte Esterase: NEGATIVE
Nitrite: NEGATIVE
Ph: 6 (ref 4.5–8.0)
Specific Gravity: 1.014 (ref 1.003–1.030)
Squamous Epithelial: NONE SEEN

## 2015-04-12 LAB — DRUG SCREEN, URINE
Amphetamines, Ur Screen: NEGATIVE
Barbiturates, Ur Screen: NEGATIVE
Benzodiazepine, Ur Scrn: NEGATIVE
CANNABINOID 50 NG, UR ~~LOC~~: NEGATIVE
COCAINE METABOLITE, UR ~~LOC~~: NEGATIVE
MDMA (Ecstasy)Ur Screen: NEGATIVE
Methadone, Ur Screen: NEGATIVE
Opiate, Ur Screen: NEGATIVE
PHENCYCLIDINE (PCP) UR S: NEGATIVE
Tricyclic, Ur Screen: NEGATIVE

## 2015-04-12 LAB — CBC WITH DIFFERENTIAL/PLATELET
BASOS PCT: 0.2 %
Basophil #: 0 10*3/uL (ref 0.0–0.1)
EOS PCT: 0.2 %
Eosinophil #: 0 10*3/uL (ref 0.0–0.7)
HCT: 39.5 % — ABNORMAL LOW (ref 40.0–52.0)
HGB: 12.8 g/dL — AB (ref 13.0–18.0)
LYMPHS PCT: 13.1 %
Lymphocyte #: 1 10*3/uL (ref 1.0–3.6)
MCH: 27.4 pg (ref 26.0–34.0)
MCHC: 32.5 g/dL (ref 32.0–36.0)
MCV: 85 fL (ref 80–100)
MONO ABS: 0.3 x10 3/mm (ref 0.2–1.0)
Monocyte %: 3.8 %
Neutrophil #: 6.1 10*3/uL (ref 1.4–6.5)
Neutrophil %: 82.7 %
PLATELETS: 288 10*3/uL (ref 150–440)
RBC: 4.68 10*6/uL (ref 4.40–5.90)
RDW: 14.5 % (ref 11.5–14.5)
WBC: 7.4 10*3/uL (ref 3.8–10.6)

## 2015-04-12 LAB — TROPONIN I
TROPONIN-I: 0.03 ng/mL
Troponin-I: 0.07 ng/mL — ABNORMAL HIGH
Troponin-I: 0.06 ng/mL — ABNORMAL HIGH

## 2015-04-12 LAB — CK: CK, Total: 518 U/L — ABNORMAL HIGH

## 2015-04-12 LAB — VALPROIC ACID LEVEL: Valproic Acid: <10 ug/mL (ref 50–100)

## 2015-04-12 LAB — COMPREHENSIVE METABOLIC PANEL
ALK PHOS: 161 U/L — AB
ANION GAP: 5 — AB (ref 7–16)
Albumin: 3.4 g/dL — ABNORMAL LOW
BILIRUBIN TOTAL: 0.2 mg/dL — AB
BUN: 51 mg/dL — ABNORMAL HIGH
CO2: 29 mmol/L
CREATININE: 1.98 mg/dL — AB
Calcium, Total: 8.9 mg/dL
Chloride: 102 mmol/L
EGFR (African American): 41 — ABNORMAL LOW
GFR CALC NON AF AMER: 35 — AB
GLUCOSE: 140 mg/dL — AB
Potassium: 4.2 mmol/L
SGOT(AST): 40 U/L
SGPT (ALT): 32 U/L
Sodium: 136 mmol/L
Total Protein: 7.9 g/dL

## 2015-04-12 LAB — ETHANOL: Ethanol: <5 mg/dL

## 2015-04-12 LAB — MAGNESIUM: Magnesium: 2.4 mg/dL

## 2015-04-13 LAB — CBC WITH DIFFERENTIAL/PLATELET
Basophil #: 0 10*3/uL (ref 0.0–0.1)
Basophil %: 0.6 %
EOS ABS: 0.1 10*3/uL (ref 0.0–0.7)
Eosinophil %: 0.8 %
HCT: 29.8 % — AB (ref 40.0–52.0)
HGB: 9.7 g/dL — ABNORMAL LOW (ref 13.0–18.0)
LYMPHS ABS: 2.1 10*3/uL (ref 1.0–3.6)
LYMPHS PCT: 32.1 %
MCH: 27.3 pg (ref 26.0–34.0)
MCHC: 32.5 g/dL (ref 32.0–36.0)
MCV: 84 fL (ref 80–100)
Monocyte #: 0.8 x10 3/mm (ref 0.2–1.0)
Monocyte %: 11.6 %
NEUTROS PCT: 54.9 %
Neutrophil #: 3.6 10*3/uL (ref 1.4–6.5)
PLATELETS: 199 10*3/uL (ref 150–440)
RBC: 3.55 10*6/uL — ABNORMAL LOW (ref 4.40–5.90)
RDW: 14.7 % — ABNORMAL HIGH (ref 11.5–14.5)
WBC: 6.6 10*3/uL (ref 3.8–10.6)

## 2015-04-13 LAB — PHENYTOIN LEVEL, TOTAL: Dilantin: 11.2 ug/mL

## 2015-04-13 LAB — BASIC METABOLIC PANEL
Anion Gap: 7 (ref 7–16)
BUN: 46 mg/dL — AB
CO2: 22 mmol/L
CREATININE: 2.35 mg/dL — AB
Calcium, Total: 7.7 mg/dL — ABNORMAL LOW
Chloride: 112 mmol/L — ABNORMAL HIGH
GFR CALC AF AMER: 33 — AB
GFR CALC NON AF AMER: 29 — AB
GLUCOSE: 108 mg/dL — AB
Potassium: 3.5 mmol/L
Sodium: 141 mmol/L

## 2015-04-13 LAB — VALPROIC ACID LEVEL: Valproic Acid: 19 ug/mL — ABNORMAL LOW (ref 50–100)

## 2015-04-14 LAB — EXPECTORATED SPUTUM ASSESSMENT W REFEX TO RESP CULTURE

## 2015-04-15 NOTE — Consult Note (Signed)
GI note: site looks good. Abd exam benign.  I understand he tolerated tube feeds well today.  will sign off.  Please call with any questions or concerns.    Electronic Signatures: Dow Adolphein, Briana Farner (MD)  (Signed on 27-Jan-15 17:44)  Authored  Last Updated: 27-Jan-15 17:44 by Dow Adolphein, Brieonna Crutcher (MD)

## 2015-04-15 NOTE — Consult Note (Signed)
PATIENT NAME:  Hilarie FredricksonWORTH, Kingstin D MR#:  [id number] DATE OF BIRTH:  05/07/1951  DATE OF CONSULTATION:  09/28/2014  REFERRING PHYSICIAN:  Katharina Caperima Vaickute, MD CONSULTING PHYSICIAN:  Hardie ShackletonKaryn M. Colin BentonEarle, PA-C for Dow AdolphMatthew Rein, MD  GASTROINTESTINAL CONSULTATION   REASON FOR CONSULTATION: Dislodged PEG tube.     HISTORY OF PRESENT ILLNESS: This is a 64 year old African American gentleman who presented to the Emergency Room with concerns of his PEG tube being out of place. 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 It is very difficult to obtain a history from him and he has no family members present in the room. Per medical records, it does appear that he has a history of a G-tube secondary to dysphagia caused by multiple strokes. His tube has been out, although it is unknown how long it has been out of place. He is able to communicate with me if I ask simple questions, and he does deny any abdominal pain, nausea or vomiting. He has not expressed any concerns of discomfort to me during our interaction. It appears that multiple attempts were made in the Emergency Room to insert the tube back in; however, attempts were unsuccessful and he was later admitted.   PAST MEDICAL HISTORY: Multiple CVAs, resulting in dementia, hemiparesis, hemiplegia and dysphagia. The patient is also nonverbal. He has a history of epilepsy, diabetes mellitus type 2, chronic kidney disease, COPD, glaucoma, coronary artery disease, anxiety, hypothyroidism, dyslipidemia, hypertension, GERD. He has also had a history of aspiration pneumonia.   PAST SURGICAL HISTORY: PEG tube placement.   FAMILY HISTORY: Unobtainable.   ALLERGIES: No known drug allergies.   SOCIAL HISTORY: Generally unobtainable, other than he resides in a nursing home.   HOME MEDICATIONS, PER MEDICAL RECORDS, INCLUDE: Align, Atorvastatin, clopidogrel, Depakote,  Diocto, donepezil, GlucaGen, Keppra, latanoprost, levothyroxine, metoprolol, ProAir, pantoprazole, Qvar, Spiriva and Vitamin D3.   REVIEW OF SYSTEMS: Unobtainable due to the patient's mental status and being nonverbal.   PHYSICAL EXAMINATION:  VITAL SIGNS: Blood pressure is 140/86, heart rate 82, respirations 20, temperature 98.2, bedside pulse oximetry 97%.  GENERAL: This is a pleasant 64 year old African American gentleman resting quietly and comfortably in bed in the exam room. He is curled up in a ball, and does seem to get some spastic motions in his extremities, but it does appear that he has movement and he is alert, but unable to tell if oriented.  HEAD: Atraumatic, normocephalic.  NECK: Supple. No lymphadenopathy noted.  HEENT:  Sclerae anicteric. Mucous membranes moist.  PULMONARY: Respirations are even and unlabored. Clear to auscultation bilateral anterior lung fields.  CARDIAC: Regular rate and rhythm. S1, S2 noted.  ABDOMEN: Soft, nontender, nondistended. Normoactive bowel sounds noted in all 4 quadrants. There is an abdominal wall opening in the suprapubic region a few inches below his umbilicus. clean and dry. non-tender.  RECTAL: Deferred.  NEUROLOGIC: There are some definite deficits noted that are consistent with his medical history of multiple strokes. He is generally nonverbal, but can respond with "yes" or "no" to basic questions.  EXTREMITIES: Negative for lower extremity edema, and 2+ pulses noted in bilateral upper extremities.   LABORATORY DATA: White blood cells 6, hemoglobin 12.6, hematocrit 39.2, platelets 194,000, MCV 87. Sodium 145, potassium 4.4, glucose 89, BUN 46, creatinine 1.80. Bilirubin 0.1, alkaline phosphatase 135, ALT 59, AST 50, albumin 2.5.   ASSESSMENT:  1.  History of multiple  cerebrovascular accidents resulting in dementia, hemiplegia, aphasia and dysphagia.  2.  History of a percutaneous endoscopic gastrostomy tube that has been displaced.   PLAN:  I have discussed this patient's case in detail with Dr. Dow Adolph, who is involved in the development of the patient's plan of care. This a difficult case, as the patient is nonverbal, so is difficult to get a full history from the patient, but does appear that the abdominal wall opening is in a peculiar location for a PEG tube. I was able to find the EGD report from 01/17/14 when the PEG was placed by Dr Shelle Iron.  He likely could benefit from an EGD with PEG tube placement for continued nutrition; however, the patient would need to be off of Plavix for about 5 days prior to [place] proceeding with this exam. Therefore, we would recommend holding Plavix for this purpose, if medically appropriate. In the interim, we could consider Dobbhoff tube feedings for supplemental nutrition. Dr. Shelle Iron will come and evaluate the patient this afternoon, and we will make further recommendations pending that evaluation and per clinical course.   All questions were answered.   Thank you so much for this consultation and for allowing [place] to participate in the patient's plan of care.   ATTENDING GASTROENTEROLOGIST: Dow Adolph, MD    ____________________________ Hardie Shackleton. Khamia Stambaugh, PA-C kme:MT D: 09/28/2014 12:18:06 ET T: 09/28/2014 13:04:27 ET JOB#: [id number]  cc: Hardie Shackleton. Janye Maynor, PA-C, <Dictator> Hardie Shackleton Marwa Fuhrman PA ELECTRONICALLY SIGNED 09/28/2014 14:21

## 2015-04-15 NOTE — Consult Note (Signed)
Details:   - GI follow up:  Unable to perform PEG today because subq heparin was given in a.m.  Will plan for PEG on Monday.   Electronic Signatures: Dow Adolphein, Thatcher Doberstein (MD)  (Signed 23-Jan-15 16:50)  Authored: Details   Last Updated: 23-Jan-15 16:50 by Dow Adolphein, Jimya Ciani (MD)

## 2015-04-15 NOTE — Consult Note (Signed)
GI Note placed today.  Pre-procedurel abx not given because pt already receiving zosyn.    start clear liquids PO and through tube in 4 hoursstart tube feeds in amnutrition consult. apply bacitracin ointment dailydo not place gauze between rubber bumper and skin,  only place gauze on top of bumper.    Electronic Signatures: Dow Adolphein, Matthew (MD)  (Signed on 26-Jan-15 13:03)  Authored  Last Updated: 26-Jan-15 13:03 by Dow Adolphein, Matthew (MD)

## 2015-04-15 NOTE — H&P (Signed)
PATIENT NAME:  [name], [name] MR#:  [id number] DATE OF BIRTH:  06-18-1951  DATE OF ADMISSION:  09/28/2014  PRIMARY CARE PHYSICIAN: Okey Regal A. Henry-Smith, M.D., from [facility].  HISTORY OF PRESENT ILLNESS:  The patient is a 64 year old African American male with multiple medical problems including history of strokes, multiinfarct dementia, epilepsy, hemiplegia, also dysphagia due to stroke, who presents from [facility] due to dislodged G-tube. Apparently, he has been removing his tube intermittently and now his tube is out for an unknown period of time. The patient, himself, is not able to provide any history, as he is nonverbal, but he was brought to the Emergency Room where attempts to insert the tube were made by the Emergency Room physician. However, it was unsuccessful and hospitalist services were contacted for admission.   PAST MEDICAL HISTORY: Significant for history of multiple medical problems including epilepsy, hemiplegia, hemiparesis, dementia, type 2 diabetes mellitus, history of chronic kidney disease, candidiasis, pneumonitis due to aspiration, vitamin D deficiency, glaucoma, COPD, dysphagia, mood disorder, atherosclerotic heart disease, PEG tube placement, anxiety disorder, hypothyroidism, hyperlipidemia, hypertension, gastroesophageal reflux disease, admission in January 2015 for seizures, history of aspiration pneumonitis. Medical history is also significant for anemia.   PAST SURGICAL HISTORY: PEG tube placement. According to prior medical records, no surgical history.   SOCIAL HISTORY: He is a nursing home resident at [facility]. No other information.   FAMILY HISTORY: Not obtainable.   REVIEW OF SYSTEMS: Not obtainable.   ALLERGIES: None according to medical records.   HOME MEDICATIONS: As follows, Align 4 mg per PEG tube daily, atorvastatin 40 mg per PEG tube in the morning, Clopidogrel 75 mg daily, Depakote sprinkles 125 mg 2 capsules twice daily, Diocto 10  mg in 1 mL oral liquid 5 mL once daily, donepezil 10 mg daily, GlucaGen HypoKit recombinant injectable powder for injection 1 mg subcutaneously once as needed for hypoglycemia, Keppra 100 mg in 1 mL oral solution 7.5 mL twice daily, latanoprost 0.005% ophthalmic solution to left eye daily at bedtime, levothyroxine 125 mg daily, metoprolol tartrate 25 mg 1/2 tablet twice daily, pantoprazole 40 mg per G-tube twice daily, ProAir HFA 2 puffs every 4 hours as needed, Qvar 2 puffs twice daily, Spiriva 2 inhalations once daily, vitamin D3 50,000 units once monthly.   PHYSICAL EXAMINATION:  VITAL SIGNS: On arrival to the hospital, temperature was 98.4, pulse was 88, respirations were 18, blood pressure 156/83, saturation 95% on room air.  GENERAL: This is a well-developed, well-nourished African American male in no acute distress, lying on the stretcher sideways, and kind of restless in the bed. He is moving in the bed and able to answer a few simple questions by nodding his head, otherwise is  not able to provide much history or review of systems.  HEENT: The patient does have corneal opacification on the right and the left pupil is equal, reactive to light. Extraocular movements intact. No icterus. No icterus or conjunctivitis. Has normal hearing. No pharyngeal erythema. Mucosa is moist. The patient does have some whitish coating in his mouth and his tongue. NECK: No masses. Supple, nontender. Thyroid is not enlarged. No adenopathy. No JVD or carotid bruits bilaterally. Full range of motion.  LUNGS: Clear to auscultation. A few rhonchi were heard. Otherwise, diminished breath sounds, no wheezing. No labored inspiration, increased effort, dullness to percussion or overt respiratory distress.  CARDIOVASCULAR: S1, S2 appreciated. Rhythm is regular. PMI not lateralized. Chest is nontender to palpation. Pedal pulses 1+. No lower extremity  edema, calf tenderness or cyanosis.  ABDOMEN: Soft, no significant tenderness.  No hepatosplenomegaly or masses were noted. The patient does have a gastrostomy tube entrance site in the epigastric  area which is moist; however, no significant bleeding was noted around that area or discharge at present.  RECTAL: Deferred.  MUSCLE STRENGTH: Able to move all extremities. The patient does have ability to squeeze my fingers with his right hand; however, both upper extremities relatively contracted and pressed to the chest. His strength is normal on the right lower extremity, but the patient has weakness in left upper as well as lower extremity. No cyanosis or degenerative joint disease. Not able to assess for kyphosis. Gait is not tested.  SKIN: Did not reveal any rashes, lesions, erythema, nodularity or induration. It was warm and dry to palpation. LYMPHATIC: No adenopathy in the cervical region.  NEUROLOGIC: Cranial nerves grossly intact although evaluation is very limited, not able to assess his sensory, not able to assess him for significant dysarthria. The patient is more or less aphasic. He is alert, somewhat cooperative and able to answer a few simple questions, when he is asked if he has any abdominal discomfort or pain. Memory is difficult to assess. I did not notice any confusion, agitation, or depression.   LABORATORY DATA: BMP showed a BUN and creatinine of 46 and 1.80, otherwise BMP was unremarkable. The patient's liver enzymes revealed albumin level of 2.5, total bilirubin 0.1, alkaline phosphatase 135, AST 50. The patient's white cell count is normal at 6.0, hemoglobin was 12.6, platelet count 194,000. Absolute neutrophil count is 3.2.   No EKG is available.   RADIOLOGIC STUDIES: None.   ASSESSMENT AND PLAN:  1. Malfunction of percutaneous endoscopic gastrostomy tube. We will ask the gastroenterologist to evaluate the patient and we will keep the patient n.p.o. We will continue IV fluids for now.  2.    Dysphagia. As above, we will continue IV fluids and we will keep  him n.p.o.  3. Elevated transaminases, we will follow, likely related to statins.  4. Anemia, seems to be stable since prior.  5. Chronic renal failure, seemed to be stable from prior.   TIME SPENT: 50 minutes.    ____________________________ Katharina Caperima Evelyn Aguinaldo, MD rv:JT D: 09/28/2014 08:25:57 ET T: 09/28/2014 09:16:12 ET JOB#: [id number]  cc: Katharina Caperima Conley Delisle, MD, <Dictator> Marry Guanarol A. Verdis PrimeHenry-Smith, MD Terrion Poblano MD ELECTRONICALLY SIGNED 10/21/2014 12:29

## 2015-04-15 NOTE — Discharge Summary (Signed)
PATIENT NAME:  [name], [name] MR#:  [id number] DATE OF BIRTH:  Dec 25, 1950  DATE OF ADMISSION:  01/07/2014 DATE OF DISCHARGE:  01/19/2014  DISPOSITION: The patient is going to [facility].    DISCHARGE DIAGNOSES: 1.  Seizures.  2.  History of cerebrovascular accident.  3.  Aspiration pneumonia.  4.  Dysphagia.  5.  Chronic kidney disease stage III. 6.  Hyperlipidemia.  7.  Chronic obstructive pulmonary disease.   DISCHARGE MEDICATIONS:  1.  Vitamin D3, 5000 units via PEG tube every month.  2.  Lipitor 40 mg once a day via PEG.  3.  Plavix 75 mg via PEG daily.  4.  Aricept 10 mg via PEG daily.  5.  Levemir 5 units once a day.  6.  Keppra 1500 mg at bedtime.  7.  Metoprolol 25 mg half tablet p.o. b.i.d.  8.  Levothyroxine 150 mg via PEG daily.  9.  QVAR 40 mcg 2 puffs daily.  10.  Pantoprazole 40 mg via PEG daily.  11.  Spiriva 18 mcg inhalation daily.  12.  Aspirin 81 mg via PEG daily.  13.  Bacitracin.  Apply bacitracin ointment to the PEG area daily.  14.  Glucerna 1.5 calorie bolus, 120 mL, that is 1 can every 4 hours.  15.  Free water flushes 250 mL every 4 hours.  16.  The patient will be on the honey thick liquids for pleasure, oral teaspoons of honey consistency liquids only like pleasuring feedings with strict aspiration precautions. The patient should not have a more than pleasure feedings. He is on bolus tube feeding of Glucerna.    FOLLOWUP:  With primary doctor in 2 to 4 weeks.   CONSULTATIONS:  1.  Neurology consult with Dr. Olin PiaYapundich. 2.  GI consult with Dr. Shelle Ironein.     HOSPITAL COURSE: Refer to interim discharge dictation done on 23rd by Dr. Auburn BilberryShreyang Patel. Followed the patient from the 23rd.  1.  The patient is a 64 year old African American with history of coronary artery disease, CVA, COPD, sent from [place] [facility] because of seizure activity. The patient was also found to have pneumonia. The patient was started on IV Keppra and also Celebrex.  Neurology has seen the patient. The patient did have an EEG and the patient's EEG did not show any active seizure. He was in ICU briefly because of continued seizure activity in the left arm and Dr. Olin PiaYapundich thought the patient may be having partial status epilepticus. Monitored in ICU. He wanted to give IV Celebrex which we did.  The patient did not have any further seizures. Right now he is on Keppra and Depakote and he did receive IV fosphenytoin 100 mg q. 12 from 01/17 to 01/25. Initially on 01/17 he got fosphenytoin loading dose of 1 gram (IV> The patient right now is on Keppra 1500 mg q. 12 hours and he does not have any more seizures. He is also onseizure precautions.2.  Dysphagia. The patient does have dysphagia seen by speech therapy and the patient did have an EGD and PEG placement because of not meeting calories via mouth with continued confusion. We thought the patient is at high risk for recurrent aspiration. So he did receive a PEG tube on 01/26 and the patient right now is on tube feeding, bolus feedings and tolerating them well.  Speech therapy, recommended only pleasure sips of honey thick liquids, but nothing more than that.  The patient needs repeat swallow study as an outpatient.  3.  Diabetes. The patient can have low-dose Lantus at night.  4.  Aspiration pneumonia. The patient already finished Levaquin and also Zosyn.  5.  Hypertension. Controlled.  6.  History of dementia.  7.  History of COPD.    DISCHARGE VITAL SIGNS:  Heart rate 80, blood pressure 150/86, saturation 97% on room air.   The patient is tolerating the tube feedings very well and the patient has low phosphorus today of 1.9. He can get Neutra-Phos 2 packets every 8 hours for 3 doses and then after that he does not need to have phosphorus supplements.  The patient had history of CVA. He is on statins, aspirin and Plavix. Continue them.   TIME SPENT ON DISCHARGE PREPARATION: More than 30  minutes.  ____________________________ Katha Hamming, MD sk:dp D: 01/19/2014 13:34:51 ET T: 01/19/2014 16:22:27 ET JOB#: [id number]  cc: Katha Hamming, MD, <Dictator> Katha Hamming MD ELECTRONICALLY SIGNED 01/31/2014 15:21

## 2015-04-15 NOTE — Consult Note (Signed)
I have seen and examined Mr [name] and agree with [name]'s a/p.  seems to be having significant coughing with eating and is a high aspiration risk per speech.  and primary hospitalist are in agreement with PEG tube placement.  plan for PEG tomorrow afternoon.   am anti-coag. NPO after midnight.   Electronic Signatures: Dow Adolphein, Shanelle Clontz (MD)  (Signed on 22-Jan-15 17:24)  Authored  Last Updated: 22-Jan-15 17:24 by Dow Adolphein, [name] (MD)

## 2015-04-15 NOTE — Discharge Summary (Signed)
PATIENT NAME:  [name], SYME MR#:  [id number] DATE OF BIRTH:  1951/01/24  DATE OF ADMISSION:  09/28/2014 DATE OF DISCHARGE:  10/04/2014  ADMITTING PHYSICIAN:  Dr. Theodoro Grist.   DISCHARGING PHYSICIAN:  Dr. Gladstone Lighter.    CONSULTATIONS IN THE HOSPITAL: GI consultation by Dr. Rayann Heman.   PRIMARY CARE PHYSICIAN:  Dr. Devoria Albe.    DISCHARGE DIAGNOSES:  1. Dislodged percutaneous endoscopic gastrostomy tube status post replacement done on 10/03/2014.  2. Cerebrovascular accident with left hemiplegia.  3. Seizure disorder.  4. Vascular dementia.  5. Benign prostatic hypertrophy.  6. Chronic obstructive pulmonary disease.  7. Hypothyroidism.   DISCHARGE MEDICATIONS:  1. Latanoprost 0.005% ophthalmic solution 1 to the left eye once at bedtime 2. Qvar 40 mcg 2 puffs inhalation twice a day.  3. Align 4 mg capsule enteral once a day.  4. Atorvastatin 40 mg once a day in the morning.  5. Diocto 10 mg per mL oral liquid 5 mL enteral once a day.   6. Aricept 10 mg p.o. once a day at bedtime.  7. Levothyroxine 175 mcg enteral once a day.  8. Spiriva inhalation capsule once a day.  9. Vitamin D3, 50,000 international units enteral once a month on 6th of each month.  10. Depakote sprinkles 125 mg oral delayed release capsule 2 capsules enteral twice a day.  11. Keppra 100 mg per mL oral solution 7.5 mg enteral q. 12 hours.   12. Metoprolol tartrate 12.5 mg enteral twice a day.  13. Protonix 40 mg 1 packet enteral twice a day.  14. Glucagon 1 mg subcutaneous kit as needed for hypoglycemia.  15. ProAir inhaler 2 puffs 4 times a day.  16. Plavix 75 mg enteral once a day to be started from 10/05/2014.   DISCHARGE DIET:  1.  The patient on tube feeds, bolus feeds Nephro Carb Steady at 6:00 am, 10:00 a.m., 2:00 p.m., 6:00 p.m. and 10:00 p.m. Flush with 50 mL water before and after each feed.   2.  Check residual prior to each feed and hold if residual is greater than 500 mL and head of bed  greater than 30 degrees and aspiration precautions.   DISCHARGE ACTIVITY: As tolerated, however the patient is bedbound.   FOLLOWUP INSTRUCTIONS:  PCP follow-up in 1 week.   LABORATORY DATA AND IMAGING STUDIES PRIOR TO DISCHARGE:  1.  Sodium 145, potassium 4.1, chloride 112, bicarbonate 26, BUN 22, creatinine 1.67, glucose 109 and calcium of 7.9.  2.  WBC 6.2, hemoglobin 12.3, hematocrit 38.6, platelet count 186,000. ALT 59, AST 50, alkaline phosphatase 135, albumin of 2.5.   3.  Upper GI endoscopy for PEG tube placement was done and showed normal esophagus, gastric ulcer which is healing at previous PEG site, there is some scar tissue noted as well.    BRIEF HOSPITAL COURSE: [name] is a 64 year old African-American male with past medical history significant for multiple strokes with vascular dementia, left-sided hemiplegia, bedbound at baseline, history of seizure disorder, dysphagia, status post PEG tube, sent in from [place] due to dislodged G tube.  The patient was on Plavix initially for his CVA so immediately PEG tube replacement was not done, seen by GI and PEG tube was replaced on 10/03/2014. Until then the patient was on Dobbhoff feeds.  The PEG tube is functioning properly and bolus feeds can be restarted prior to discharge. His sodium was slightly elevated in the hospital, he was on D5W and free water flushes, now  they are adjusted so he will not get hypernatremic again. All of his other home medications are being continued. His course has been otherwise uneventful in the hospital.   DISCHARGE CONDITION: Stable.   DISCHARGE DISPOSITION: To [facility].   TIME SPENT ON DISCHARGE: 40 minutes.   CODE STATUS: Full code.     ____________________________ Gladstone Lighter, MD rk:bu D: 10/04/2014 12:43:24 ET T: 10/04/2014 12:56:33 ET JOB#: [id number]  cc: Gladstone Lighter, MD, <Dictator> Gladstone Lighter MD ELECTRONICALLY SIGNED  10/05/2014 18:07

## 2015-04-15 NOTE — Consult Note (Signed)
Details:   - GI Note:  Dubhoff is clogged.  Recommend repalcement by radiology and no meds through dubhoff.  Only tube feeds.  Meds to be given IV.   Will plan for PEG placement on Monday. Hold tube feeds Sunday at midnight. No am lovenoxi or heparin on Monday morning.   Electronic Signatures: Dow Adolphein, Lakyra Tippins (MD)  (Signed 09-Oct-15 15:41)  Authored: Details   Last Updated: 09-Oct-15 15:41 by Dow Adolphein, Talli Kimmer (MD)

## 2015-04-15 NOTE — Consult Note (Signed)
Details:   - GI Note:  S/p PEG placement yesterday.   Denies abd pain, rectal bleeding, f/c.   O: afebrile, vs normal.  Abd: PEG site appears normal.  Bumper at 3.5 cm. No abd tenderness.   A/P: - I loosene the bumber to 4.5 cm to avoid skin breakdown.  - ok to start tube feeds. - safe for discharge. Apply bacitracin topical to site daily for 5 days. No need for large dressing over the bumper.   Electronic Signatures: Dow Adolphein, Matthew (MD)  (Signed 13-Oct-15 12:50)  Authored: Details   Last Updated: 13-Oct-15 12:50 by Dow Adolphein, Matthew (MD)

## 2015-04-15 NOTE — Consult Note (Signed)
Details:   - GI Note:  I have seen and examined Mr [name] and agree with [name]'s a/p.   His PEG site appears closed.  He is also on Plavix so cannot do another PEG until plavix held for 5 days.  So let's get a swallow eval to see if he is still unsafe to eat on his own.  If he fails swallow eval, will plan for PEG once off plavix x 5 days. For nutrition until them, will need to have dubhoff placed by radiology.   Electronic Signatures: Dow Adolphein, Rigoberto Repass (MD)  (Signed 07-Oct-15 16:15)  Authored: Details   Last Updated: 07-Oct-15 16:15 by Dow Adolphein, Camala Talwar (MD)

## 2015-04-15 NOTE — H&P (Signed)
PATIENT NAME:  [name], [name] MR#:  [id number] DATE OF BIRTH:  [birth year]  DATE OF ADMISSION:  01/07/2014  PRIMARY CARE PHYSICIAN: Verdis Prime, MD  REFERRING EMERGENCY ROOM PHYSICIAN: Loleta Rose, MD  CHIEF COMPLAINT: Seizure today.   HISTORY OF PRESENT ILLNESS: A 64 year old [place] American male with a history of CAD, CVA, COPD, was sent from nursing home to ED due to seizure activity today. The patient is noncommunicative, closed his eyes, unable to provide any information. I called the patient's sister and nobody answered the phone. I talked with Dr. York Cerise. According to Dr. York Cerise, the patient was noted to have a seizure today which is a generalized seizure, shaking all over the body. In addition, the patient has had choking for the past few days. The patient's chest x-ray showed pneumonia was suspected due to aspiration. Also the patient has elevated WBC. The patient was treated with Zosyn in the ED.   PAST MEDICAL HISTORY: CAD, CVA with left-sided weakness, COPD, anemia, CKD,  hyperlipidemia, diabetes,   PAST SURGICAL HISTORY: According to previous documents, no surgical history.   SOCIAL HISTORY: Nursing home resident. [facility]. No other information.   FAMILY HISTORY: Unable to obtain.   REVIEW OF SYSTEMS: Unable to obtain due to the patient's mental status.   ALLERGIES: None.   HOME MEDICATIONS:  Reconciliation list has not been done yet. Please refer to the medication reconciliation list.   PHYSICAL EXAMINATION:  VITAL SIGNS: Temperature 98.9, blood pressure 177/79, pulse 90, respirations 16, oxygen saturation 100% with oxygen by nasal cannula.  GENERAL: Lethargic, noncommunicative, closed his eyes. Critical looking.  HEENT: Right eye blind. Left eye: Pupil round and reacts to light. No discharge from ears or nose. Dry oral mucosa.  NECK: Supple. No JVD or carotid bruit. No lymphadenopathy. No thyromegaly.  CARDIOVASCULAR: S1, S2, regular rate and rhythm. No murmurs  or gallops.  PULMONARY: Bilateral air entry. No wheezing or rales. No use of accessory muscles to breathe. Soft. Bilateral air entry. Bilateral crackles, especially on the right side.      ABDOMEN: Soft. No distention or tenderness. No organomegaly. Bowel sounds present. EXTREMITIES: No edema, clubbing or cyanosis. No calf tenderness.  SKIN: No rash or jaundice but has dry skin.  NEUROLOGY: Lethargic, noncommunicative, unable to examine.   LABORATORY DATA:  1.  CAT scan of head: No acute intracranial findings. Extensive atrophy and severe white matter small vessel ischemia.  2.  WBC 15.1, hemoglobin 12.9, platelets 264.  3.  CHEST X-RAY: Low lung volume with basilar opacities. Possible trace left upper pleural effusion.  4. Urinalysis is negative.  5.  Glucose 225, BUN 22, creatinine 2.14, sodium 135, potassium 5.4, bicarb 28, troponin 0.02. 6.  EKG showed sinus rhythm with first-degree A-V block at 97 BPM.   IMPRESSION:  1.  New-onset seizure.  2.  Aspiration pneumonia.  3.  Acute renal failure on chronic kidney disease.  4.  Hyperkalemia.  5.  Hypertension.  6.  Diabetes.  7.  Coronary artery disease.  8.  Chronic obstructive pulmonary disease.  9.  Anemia.  10. History of CVA with left-sided weakness   PLAN OF TREATMENT:  1.  The patient will be admitted to medical floor. We will start seizure, aspiration, and fall precautions. Keep n.p.o. We will get a swallowing study. Will give Keppra 500 mg IV b.i.d. and get a neurology consult and neuro checks. Ativan p.r.n.  2.  For aspiration pneumonia, we will start Zosyn, vancomycin, and Levaquin. Follow up CBC,  blood culture, sputum culture.  3.  For acute renal failure, CKD and hyperkalemia, we will give normal saline IV and follow up BMP.  4.  For diabetes, we will start a sliding scale and followup hemoglobin A1c.  5.  For hypertension, we will give hydralazine IV p.r.n. and continue Lopressor.  6.  I CALLED THE PATIENT'S DAUGHTER  TWICE. NOBODY ANSWERED THE PHONE. WE WILL PUT THE PATIENT ON FULL CODE AT THIS TIME. PLEASE FOLLOW UP WITH FAMILY MEMBER TO CLEAR THE PATIENT'S CODE STATUS.   TIME SPENT: About 62 minutes.   ____________________________ Shaune PollackQing Tyr Franca, MD qc:np D: 01/07/2014 19:11:35 ET T: 01/07/2014 20:11:54 ET JOB#: [id number]  cc: Shaune PollackQing Brentlee Delage, MD, <Dictator> Shaune PollackQING Waleed Dettman MD ELECTRONICALLY SIGNED 01/08/2014 12:10

## 2015-04-15 NOTE — Consult Note (Signed)
Details:   - GI Note.  PEG not done Friday due to getting am subq heparin.  I will try to fit the PEG in on Monday.  - NPO after midnight. - hold am anti-coagulation.   If cannot be done on Monday, will be able to due on Tues.   Electronic Signatures: Dow Adolphein, Yaden Seith (MD)  (Signed 25-Jan-15 20:13)  Authored: Details   Last Updated: 25-Jan-15 20:13 by Dow Adolphein, Serina Nichter (MD)

## 2015-04-15 NOTE — Consult Note (Signed)
Details:   - GI Note:  PEG placed.  Difficult case due to scar tissue and diffiulty getting trocar to advance into stomach.   Recs:  - water and meds through g-tube starting in 4 hours. - tube feeds can resume tomorrow in am.  - ok to start plavix in 48 hours.  - bumper at 3.5 cm, will loosen to 4 cm tomorrow.   Electronic Signatures: Dow Adolphein, Lenn Volker (MD)  (Signed 12-Oct-15 14:26)  Authored: Details   Last Updated: 12-Oct-15 14:26 by Dow Adolphein, Chaitanya Amedee (MD)

## 2015-04-15 NOTE — Consult Note (Signed)
PATIENT NAME:  [name] MR#:  [id number] DATE OF BIRTH:  10-18-1951  DATE OF CONSULTATION:  01/07/2014  REFERRING PHYSICIAN:  Herschell Dimes. Renae Gloss, MD CONSULTING PHYSICIAN:  Mario Alm. Olin Pia, MD  DIAGNOSIS: New-onset seizures.   HISTORY OF PRESENT ILLNESS: [name] is a 64 year old black male with a known prior history of a stroke, heart disease, COPD, resident of local nursing home, who is undergoing evaluation for new-onset seizure. History is according to review of the hospital chart and conversation with the patient's sister at the bedside.   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 He moved to the area from [place], [place], probably about 5 to 6 years ago and in the last 3 years has resided at [facility]  nursing home facility. Apparently yesterday he was admitted after having an observed generalized tonic-clonic seizure that lasted apparently 30 minutes according to his sister. He reported he was given some 8 mg of Ativan IM and Keppra and then transferred to [facility]. He underwent a head CT that was unremarkable, but did have a chest x-ray that showed pneumonia suspicious for an aspiration pneumonia with an elevated white blood cell count. He was given some Zosyn in the Emergency Room and admitted to the floor for further evaluation. Since admission, he has had no further seizures but according to a note from Dr. Renae Graham this morning, he was poorly responsive and required a sternal rub to get any response. It was felt that he had a prolonged postictal state, but did not appear to be in status, and was loaded with Cerebyx and kept on IV Keppra. He has continued to be poorly responsive through the day, but will respond to commands at times. His sister indicates that his current speech and mental status does not appear to be his baseline.   As noted above, he  does have a history of a stroke sometime prior to 2010 that left him with left hemiplegia. Over the past 3 years, she notes that he has had progressively more difficulties speaking and swallowing. At baseline, he needs help with all of his daily activities and is blind in his right eye, but she indicates that his speech is much clearer than it is at the present time. Other than seeing a neurologist in [place], [place], for his stroke, he apparently has not seen a neurologist recently.   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 There is no history of seizures.   PAST SURGICAL HISTORY: Includes apparently none documented.   MEDICATIONS: At this time include Cerebyx injection, heparin, hydralazine, Keppra 500 mg b.i.d., Ativan, morphine, Zofran, pantoprazole, Zosyn.  ALLERGIES: No known drug allergies.  SOCIAL HISTORY: He resides at a local nursing home facility as noted above.   FAMILY HISTORY: Reviewed and noncontributory.  REVIEW OF SYSTEMS: Neurological review of systems as noted above.   PHYSICAL EXAMINATION:  GENERAL: Reveals a late middle-aged black male who is in no acute distress.  VITAL SIGNS: He is afebrile. Vital signs stable. Blood pressure 149/82.  NEUROLOGIC: He currently appears to be alert, but orientation and commands are very difficult process due to his severe dysarthria. He appears to slightly protrude his tongue and appears to have poor control of his  oral secretions. He appears to be blind in his right eye and is able to move his left eye. He does appear to have what appears to be intact vision in left eye. Face is otherwise symmetric, although there is persistent twitching noted over the left face consistent with probable ongoing focal seizures. He will follow simple commands. On motor  examination, he has spastic left hemiplegia. He can move his right side, but appears to be somewhat discoordinated. It is difficult to assess sensory exam due to his severe dysarthria. Deep tendon reflexes were grade 3 on the left, grade 2+ on the right. Right toe downgoing, left toe equivocal.  CARDIOVASCULAR: Regular rate and rhythm without murmur, rub or gallop. No evidence of carotid bruits. No cyanosis, clubbing or edema.  ABDOMEN: Soft, nontender, with normoactive bowel sounds.  LUNGS: Clear to auscultation.  SKIN: No obvious cuts, abrasions, bruises or rashes. He does have a Foley in place.   LABORATORY AND RADIOLOGICAL DATA: Include magnesium of 2. Hemoglobin A1c of 8.9. Metabolic panel from today reveals an elevated glucose of 206, elevated BUN and creatinine of 20 and 2.14. His CBC reveals an elevated white count of 11.8 with a platelet count of 247.   He did have a head CT performed yesterday, which revealed extensive atrophy, severe white matter changes, but otherwise no acute findings. Interestingly, there was no mention made of any evidence of encephalomalacia in the right hemisphere from his prior stroke.   ASSESSMENT: 1.  New-onset generalized seizure.  2.  Possible partial status (persistent left facial/shoulder twitching).  3.  History of right hemispheric stroke with left hemiplegia.   DISCUSSION: At this time, [name] presents with a known prior history of right hemispheric stroke and associated left hemiplegia as described above. He has never had any prior history of seizures, but now is admitted with a new-onset generalized seizure yesterday as described above. He was given some Ativan and Keppra as an outpatient and then has been also given some Cerebyx today, but his mental status appears to have remained fairly poor with that. His speech has not returned to baseline according to his sister's observations. He does appear to have some persistent twitching of the left side of  his face and his left shoulder that could indicate ongoing focal seizures. Additionally, his mental status appears to be fluctuating, and the possibility of nonconvulsive seizures cannot be excluded.   RECOMMENDATION: I would continue Keppra and Cerebyx that was started this morning. I will try to increase his Keppra to 1 gram b.i.d. and check a Dilantin level. Of note is that he does have some renal insufficiency. I would suggest head MRI given the new-onset nature of his seizures. I would continue his current medical care of his underlying febrile illness and keep him n.p.o. until cleared by speech therapy, along with head of bed elevated. If there is no identifiable source of his fever and white count, then he may need a lumbar puncture. I have discussed the case with Dr. Luberta Mutter (on call tonight) and will go ahead and transfer [name] to the Intensive Care Unit for closer observation. If his mental status continues to fluctuate or does not return to baseline, then he may need to be transferred to another facility for continuous EEG monitoring given the inability to obtain EEG here over the weekend.   Thank you very much for allowing me to participate in the care of this very interesting and pleasant patient.   ____________________________ Mario Alm.  Olin PiaYapundich, MD ray:jcm D: 01/08/2014 20:43:00 ET T: 01/08/2014 21:43:09 ET JOB#: [id number]  cc: Molly Maduroobert A. Olin PiaYapundich, MD, <Dictator> Theodosia QuayOBERT A Lasheena Frieze MD ELECTRONICALLY SIGNED 02/12/2014 20:41

## 2015-04-15 NOTE — Consult Note (Signed)
PATIENT NAME:  [name], [name] MR#:  [id number] DATE OF BIRTH:  09/29/1951  DATE OF CONSULTATION:  01/13/2014  REFERRING PHYSICIAN:  Dr. Patel. CONSULTING PHYSICIAN:  Synia Douglass M. Kalyan Barabas, PA-C dictating for Dr. Matthew Rein.  REASON FOR CONSULTATION:  Dysphagia. Evaluate for a PEG tube.  ATTENDING GASTROENTEROLOGIST:  Matthew Rein, MD  HISTORY OF PRESENT ILLNESS:  This is a 64-year-old gentleman who has been admitted for several days now with a new onset seizure. He was transferred from his nursing home for the seizure activity. He does have a past medical history significant for coronary artery disease, a history of a CVA, COPD and chronic kidney disease. Also upon admission he was found to have aspiration pneumonia. He has been admitted now for several days for treatment of these issues. Unfortunately, the patient's current mental status prevents him from being able to sufficiently communicate with me and there is no family members in the room for me to get a more accurate history from. Per medical records, he has tried to advance his diet and still continues to choke. Speech-Language Pathology has consulted on him earlier in his admission and it looks as though his diet for some time was honey thick. Unfortunately, he does continue to choke and there are some concerns of his little p.o. intake and potentially some malnutrition down the line. Therefore, we were asked to consult for possible PEG tube. Per medical record, it appears that his daughter has consented to this procedure when speaking with the hospitalist. Speech Therapy was also reconsulted today to re-evaluate the patient. The remainder of the history is unobtainable due to the patient's mental status.   ALLERGIES:  No known drug allergies.   PAST MEDICAL HISTORY: 1.  Coronary artery disease.  2.  CVA with left-sided weakness.  3.  Chronic kidney disease.  4.  Dyslipidemia.  5.  Diabetes mellitus.  6.  COPD.    PAST SURGICAL HISTORY:  Per  medical records, the patient has no prior surgeries.   SOCIAL HISTORY:  The patient resides in a nursing home. The remainder of the social history is unobtainable due to the patient's mental status.   FAMILY HISTORY:  Unobtainable.   REVIEW OF SYSTEMS:  Unobtainable due to the patient's mental status.   OBJECTIVE:  VITAL SIGNS:  Blood pressure 130/81, heart rate 75, respirations 18, temperature 97.8, bedside pulse ox is 100%.  GENERAL:  This is a 64-year-old gentleman resting quietly in bed. He is alert but I am unable to decipher if he is oriented or not as he is nonverbal at the current moment.  HEAD:  Atraumatic, normocephalic.  NECK:  Supple. No lymphadenopathy noted.  HEENT:  Sclerae anicteric. Mucous membranes moist.  LUNGS:  Respirations are even and unlabored.  HEART:  Regular rate and rhythm. S1, S2 noted.  ABDOMEN:  Soft, nontender, nondistended. Normoactive bowel sounds noted in all 4 quadrants. No guarding or rebound. No masses, hernias or organomegaly appreciated.  RECTAL:  Deferred.  PSYCHIATRIC:  The patient's mental status is significantly offered, unable to decipher his mood or affect.  EXTREMITIES:  Negative for lower extremity edema, 2+ pulses noted in bilateral upper extremities.   LABORATORY DATA:  White blood cells 8.9, hemoglobin 11.9, hematocrit 36.3, platelets 246. Sodium 142, potassium 4.1, BUN 19, creatinine 2.11, glucose 83.   IMAGING:  A CT scan of the head shows extensive atrophy. There is also severe white matter small vessel ischemia. No acute CVA or changes identified.   Multiple chest x-rays   have been obtained on the patient since the admission to evaluate the status of his aspiration pneumonia.   ASSESSMENT:  1.  Dysphagia/choking spells. Since being admitted, the patient has had a very difficult time tolerating a p.o. diet.  2.  Aspiration pneumonia.  3.  Seizure activity, new onset. This is what initially brought him into the ER from his nursing  home.  4.  A history of coronary artery disease, chronic obstructive pulmonary disease, chronic renal disease and cerebrovascular accident.    PLAN:  I have discussed this patient's case in detail with Dr. Matthew Rein, who is involved in the development of the patient's plan of care. The patient is extremely difficult to get a history from but per the medical record, he has continued to choke when attempting a p.o. diet. It appears that the daughter has been informed about the possibility of a PEG tube and has verbalized agreement for this procedure. It does appear that Speech Therapy has evaluated the patient earlier on in his admission and were asked to re-evaluate earlier today. I have not seen this report yet, but it appears he is high aspiration risk. Certainly, he could be a candidate for PEG tube placement for supplemental nutrition. We will continue to monitor this patient throughout hospitalization and make further recommendations pending above. Plan for EGD with PEG tomorrow 01/14/14.  Thank you so much for this consultation and for allowing us to participate in the patient's plan of care.   These services provided by Alyria Krack, PA-C under collaborative agreement with Matthew Rein, MD  ____________________________ [name] M. Breahna Boylen, PA-C kme:jm D: 01/13/2014 16:22:51 ET T: 01/13/2014 16:51:30 ET JOB#: [id number]  cc: Makenzie Weisner M. Leigh Blas, PA-C, <Dictator> Elizabella Nolet M Hassani Sliney PA ELECTRONICALLY SIGNED 01/14/2014 8:29 

## 2015-04-15 NOTE — Consult Note (Signed)
PATIENT NAME:  [name], [name] MR#:  [id number] DATE OF BIRTH:  18-Jul-1951  DATE OF CONSULTATION:  01/13/2014  REFERRING PHYSICIAN:  Dr. Allena KatzPatel. CONSULTING PHYSICIAN:  Hardie ShackletonKaryn M. Colin BentonEarle, PA-C dictating for Dr. Dow AdolphMatthew Rein.  REASON FOR CONSULTATION:  Dysphagia. Evaluate for a PEG tube.  ATTENDING GASTROENTEROLOGIST:  Dow AdolphMatthew Rein, MD  HISTORY OF PRESENT ILLNESS:  This is a 64 year old gentleman who has been admitted for several days now with a new onset seizure. He was transferred from his nursing home for the seizure activity. He does have a past medical history significant for coronary artery disease, a history of a CVA, COPD and chronic kidney disease. Also upon admission he was found to have aspiration pneumonia. He has been admitted now for several days for treatment of these issues. Unfortunately, the patient's current mental status prevents him from being able to sufficiently communicate with me and there is no family members in the room for me to get a more accurate history from. Per medical records, he has tried to advance his diet and still continues to choke. Speech-Language Pathology has consulted on him earlier in his admission and it looks as though his diet for some time was honey thick. Unfortunately, he does continue to choke and there are some concerns of his little p.o. intake and potentially some malnutrition down the line. Therefore, we were asked to consult for possible PEG tube. Per medical record, it appears that his daughter has consented to this procedure when speaking with the hospitalist. Speech Therapy was also reconsulted today to re-evaluate the patient. The remainder of the history is unobtainable due to the patient's mental status.   ALLERGIES:  No known drug allergies.   PAST MEDICAL HISTORY: 1.  Coronary artery disease.  2.  CVA with left-sided weakness.  3.  Chronic kidney disease.  4.  Dyslipidemia.  5.  Diabetes mellitus.  6.  COPD.    PAST SURGICAL HISTORY:  Per  medical records, the patient has no prior surgeries.   SOCIAL HISTORY:  The patient resides in a nursing home. The remainder of the social history is unobtainable due to the patient's mental status.   FAMILY HISTORY:  Unobtainable.   REVIEW OF SYSTEMS:  Unobtainable due to the patient's mental status.   OBJECTIVE:  VITAL SIGNS:  Blood pressure 130/81, heart rate 75, respirations 18, temperature 97.8, bedside pulse ox is 100%.  GENERAL:  This is a 64 year old gentleman resting quietly in bed. He is alert but I am unable to decipher if he is oriented or not as he is nonverbal at the current moment.  HEAD:  Atraumatic, normocephalic.  NECK:  Supple. No lymphadenopathy noted.  HEENT:  Sclerae anicteric. Mucous membranes moist.  LUNGS:  Respirations are even and unlabored.  HEART:  Regular rate and rhythm. S1, S2 noted.  ABDOMEN:  Soft, nontender, nondistended. Normoactive bowel sounds noted in all 4 quadrants. No guarding or rebound. No masses, hernias or organomegaly appreciated.  RECTAL:  Deferred.  PSYCHIATRIC:  The patient's mental status is significantly offered, unable to decipher his mood or affect.  EXTREMITIES:  Negative for lower extremity edema, 2+ pulses noted in bilateral upper extremities.   LABORATORY DATA:  White blood cells 8.9, hemoglobin 11.9, hematocrit 36.3, platelets 246. Sodium 142, potassium 4.1, BUN 19, creatinine 2.11, glucose 83.   IMAGING:  A CT scan of the head shows extensive atrophy. There is also severe white matter small vessel ischemia. No acute CVA or changes identified.   Multiple chest x-rays  have been obtained on the patient since the admission to evaluate the status of his aspiration pneumonia.   ASSESSMENT:  1.  Dysphagia/choking spells. Since being admitted, the patient has had a very difficult time tolerating a p.o. diet.  2.  Aspiration pneumonia.  3.  Seizure activity, new onset. This is what initially brought him into the ER from his nursing  home.  4.  A history of coronary artery disease, chronic obstructive pulmonary disease, chronic renal disease and cerebrovascular accident.    PLAN:  I have discussed this patient's case in detail with Dr. Dow Adolph, who is involved in the development of the patient's plan of care. The patient is extremely difficult to get a history from but per the medical record, he has continued to choke when attempting a p.o. diet. It appears that the daughter has been informed about the possibility of a PEG tube and has verbalized agreement for this procedure. It does appear that Speech Therapy has evaluated the patient earlier on in his admission and were asked to re-evaluate earlier today. I have not seen this report yet, but it appears he is high aspiration risk. Certainly, he could be a candidate for PEG tube placement for supplemental nutrition. We will continue to monitor this patient throughout hospitalization and make further recommendations pending above. Plan for EGD with PEG tomorrow 01/14/14.  Thank you so much for this consultation and for allowing [place] to participate in the patient's plan of care.   These services provided by Brantley Stage, PA-C under collaborative agreement with Dow Adolph, MD  ____________________________ Hardie Shackleton. Royalty Domagala, PA-C kme:jm D: 01/13/2014 16:22:51 ET T: 01/13/2014 16:51:30 ET JOB#: [id number]  cc: Hardie Shackleton. Rogerick Baldwin, PA-C, <Dictator> Hardie Shackleton Denise Bramblett PA ELECTRONICALLY SIGNED 01/14/2014 8:29

## 2015-04-17 LAB — CULTURE, BLOOD (SINGLE)

## 2015-04-23 NOTE — Consult Note (Signed)
Referring Physician:  Ethlyn Daniels   Primary Care Physician:  [facility], [address] [place], [place] [address], [place] [phone]  Reason for Consult: Admit Date: 11-Apr-2015  Chief Complaint: seizure  Reason for Consult: seizure   History of Present Illness: History of Present Illness:   seen at request of Dr. Jannifer Franklin for seizure;  64 yo RHD M presents to [facility] secondary to continued seizures.  Pt has baseline hx of seizure disorder and recieved multiple doses of ativan but had continued seizures at group home.  He was brought into the ER where he continued to have seizures until they stopped after IV Keppra load.  Pt was intubated for airway protection and has not had any more obvious seizures but does continue to have facial twitching per nurse.  He has some cognitive dysfunction but family is not here to give baseline not last time he had a seizure.  ROS:  Review of Systems   unobtainable secondary to mental status  Past Medical/Surgical Hx:  Seizures:   Dysphagia: baseline  Anemia:   Diabetes Mellitus,Type I (IDD):   CVA/Stroke:   CHF:   Coronary Artery Disease:   gtube:   Past Medical/ Surgical Hx:  Past Medical History reviewed by me as above   Past Surgical History reviewed by me as above   Home Medications: Medication Instructions Last Modified Date/Time  Align 4 mg oral capsule 1 cap(s) enteral once a day 20-Apr-16 08:09  atorvastatin 40 mg oral tablet 1 tab(s) enteral once a day (in the morning) 20-Apr-16 08:09  Diocto 10 mg/mL oral liquid 5 milliliter(s) enteral once a day 20-Apr-16 08:09  donepezil 10 mg oral tablet 1 tab(s) enteral once a day (at bedtime) 20-Apr-16 08:09  levothyroxine 175 mcg (0.175 mg) oral tablet 1 tab(s) enteral once a day 20-Apr-16 08:09  Spiriva 18 mcg inhalation capsule 2 each inhaled once a day 20-Apr-16 08:09  Vitamin D3 50,000 intl units oral capsule 1 cap(s) enteral once a month on the 6th of  each month 20-Apr-16 08:09  Keppra 100 mg/mL oral solution 7.5 milliliter(s) enteral every 12 hours 20-Apr-16 08:09  metoprolol tartrate 25 mg oral tablet 0.5 tab(s) enteral 2 times a day 20-Apr-16 08:09  pantoprazole 40 mg oral granule, enteric coated 1 packet(s) enteral 2 times a day 20-Apr-16 08:09  GlucaGen HypoKit recombinant 1 mg injectable powder for injection 1 milligram(s) subcutaneous once, As Needed for hypoglycemia 20-Apr-16 08:09  ProAir HFA CFC free 90 mcg/inh inhalation aerosol 2 puff(s) inhaled every 4 hours, As Needed - for Wheezing 20-Apr-16 08:09  clopidogrel 75 mg oral tablet 1 tab(s) enteral once a day START FROM 10/05/14 20-Apr-16 08:09  Senna 8.6 mg oral tablet 1 tab(s) orally once a day (at bedtime) 20-Apr-16 08:09  albuterol 2.5 mg/3 mL (0.083%) inhalation solution 3 milliliter(s) inhaled every 6 hours 20-Apr-16 08:09  LORazepam 2 mg/mL injectable solution 1 milliliter(s) injectable once for seizure 20-Apr-16 08:09  LORazepam 2 mg/mL injectable solution 1 milliliter(s) injectable now. STAT.  20-Apr-16 08:09  Dulcolax Laxative 10 mg rectal suppository 1 suppository(ies) rectal once a day, As Needed - for Constipation 20-Apr-16 08:09  Rocephin 1 g injectable powder for injection 2 gram(s) injectable once for pheumonia. 20-Apr-16 08:09  acetaminophen 325 mg oral tablet 2 tab(s) orally every 4 hours 20-Apr-16 08:09  latanoprost ophthalmic 0.005% ophthalmic solution 1 drop(s) in left eye once a day (at bedtime) 20-Apr-16 08:09  Qvar 40 mcg/inh inhalation aerosol 2 puff(s) inhaled 2  times a day 20-Apr-16 08:09   Allergies:  No Known Allergies:   Allergies:  Allergies NKDA   Social/Family History: Employment Status: disabled  Lives With: alone  Living Arrangements: group home  Social History: unknown due to mental status  Family History: unknown due to mental status   Vital Signs: **Vital Signs.:   20-Apr-16 13:00  Pulse Pulse 90  Respirations Respirations 14   Systolic BP Systolic BP 093  Diastolic BP (mmHg) Diastolic BP (mmHg) 91  Mean BP 102  Pulse Ox % Pulse Ox % 100  Pulse Ox Activity Level  At rest  Oxygen Delivery Ventilator Assisted   Physical Exam: General: overweight, severe distress  HEENT: normocephalic, sclera nonicteric, oropharynx clear; R eye cataract  Neck: supple, no JVD, no bruits  Chest: coarse BS with mild wheezing  Cardiac: tachycardic, no murmur, good pulses  Extremities: no C/C/E, FROM   Neurologic Exam: Mental Status: intubated, not sedated, opens to pain but does not follow, GCS 8T  Cranial Nerves: R cataract, L 79m and reactive, intact corneals B, good cough  Motor Exam: moves L>R, no twitching noted  Deep Tendon Reflexes: 0/4 B, mute plantars B  Sensory Exam: grimaces to pain B  Coordination: untestable secondary to mental status   Lab Results: Hepatic:  20-Apr-16 02:03   Bilirubin, Total  0.2 (0.3-1.2 NOTE: New Reference Range  02/28/15)  Alkaline Phosphatase  161 (38-126 NOTE: New Reference Range  02/28/15)  SGPT (ALT) 32 (17-63 NOTE: New Reference Range  02/28/15)  SGOT (AST) 40 (15-41 NOTE: New Reference Range  02/28/15)  Total Protein, Serum 7.9 (6.5-8.1 NOTE: New Reference Range  02/28/15)  Albumin, Serum  3.4 (3.5-5.0 NOTE: New reference range  02/28/15)  TDMs:  20-Apr-16 02:03   Valproic Acid, Serum <10.0 (Result(s) reported on 12 Apr 2015 at 11:15AM.)  Routine Micro:  20-Apr-16 02:43   Micro Text Report BLOOD CULTURE   COMMENT                   NO GROWTH IN 8-12 HOURS   ANTIBIOTIC                       Culture Comment NO GROWTH IN 8-12 HOURS  Result(s) reported on 12 Apr 2015 at 01:00PM.  Routine Chem:  20-Apr-16 02:03   Magnesium, Serum 2.4 (1.7-2.4 THERAPEUTIC RANGE: 4-7 mg/dL TOXIC: > 10 mg/dL  ----------------------- NOTE: New Reference Range  02/28/15)  Ethanol, S. <5 (0-0 NOTE: New Reference Range:  02/28/15)  Glucose, Serum  140 (65-99 NOTE: New Reference  Range  02/28/15)  BUN  51 (6-20 NOTE: New Reference Range  02/28/15)  Creatinine (comp)  1.98 (0.61-1.24 NOTE: New Reference Range  02/28/15)  Sodium, Serum 136 (135-145 NOTE: New Reference Range  02/28/15)  Potassium, Serum 4.2 (3.5-5.1 NOTE: New Reference Range  02/28/15)  Chloride, Serum 102 (101-111 NOTE: New Reference Range  02/28/15)  CO2, Serum 29 (22-32 NOTE: New Reference Range  02/28/15)  Calcium (Total), Serum 8.9 (8.9-10.3 NOTE: New Reference Range  02/28/15)  eGFR (African American)  41  eGFR (Non-African American)  35 (eGFR values <630mmin/1.73 m2 may be an indication of chronic kidney disease (CKD). Calculated eGFR is useful in patients with stable renal function. The eGFR calculation will not be reliable in acutely ill patients when serum creatinine is changing rapidly. It is not useful in patients on dialysis. The eGFR calculation may not be applicable to patients at the low and high extremes of  body sizes, pregnant women, and vegetarians.)  Anion Gap  5  Urine Drugs:  [id number]-OEU-[id number] 53:61   Tricyclic Antidepressant, Ur Qual (comp) NEGATIVE (Result(s) reported on 12 Apr 2015 at 05:05AM.)  Amphetamines, Urine Qual. NEGATIVE  MDMA, Urine Qual. NEGATIVE  Cocaine Metabolite, Urine Qual. NEGATIVE  Opiate, Urine qual NEGATIVE  Phencyclidine, Urine Qual. NEGATIVE  Cannabinoid, Urine Qual. NEGATIVE  Barbiturates, Urine Qual. NEGATIVE  Benzodiazepine, Urine Qual. NEGATIVE (------------------ The URINE DRUG SCREEN provides only a preliminary, unconfirmed analytical test result and should not be used for non-medical purposes.  Clinical consideration and professional judgment should be applied to any positive drug screen result due to possible interfering substances.  A more specific alternate chemical method must be used in order to obtain a confirmed analytical result.  Gas chromatography/mass spectrometry (GC/MS) is the preferred confirmatory method.)   Methadone, Urine Qual. NEGATIVE  Cardiac:  20-Apr-16 02:03   CK, Total  518 (44-315 NOTE: New Reference Range  02/28/15)    06:17   Troponin I 0.03 (0.00-0.03 0.03 ng/mL or less: NEGATIVE  Repeat testing in 3-6 hrs  if clinically indicated. >0.05 ng/mL: POTENTIAL  MYOCARDIAL INJURY. Repeat  testing in 3-6 hrs if  clinically indicated. NOTE: An increase or decrease  of 30% or more on serial  testing suggests a  clinically important change NOTE: New Reference Range  02/28/15)  Routine UA:  20-Apr-16 02:03   Color (UA) Yellow  Clarity (UA) Clear  Glucose (UA) 50 mg/dL  Bilirubin (UA) Negative  Ketones (UA) Negative  Specific Gravity (UA) 1.014  Blood (UA) 1+  pH (UA) 6.0  Protein (UA) >=500  Nitrite (UA) Negative  Leukocyte Esterase (UA) Negative (Result(s) reported on 12 Apr 2015 at 02:52AM.)  RBC (UA) 0-5  WBC (UA) 0-5  Bacteria (UA) RARE  Epithelial Cells (UA) NONE SEEN  Mucous (UA) PRESENT  Hyaline Cast (UA) PRESENT (Result(s) reported on 12 Apr 2015 at 02:52AM.)  Routine Hem:  20-Apr-16 02:03   WBC (CBC) 7.4  RBC (CBC) 4.68  Hemoglobin (CBC)  12.8  Hematocrit (CBC)  39.5  Platelet Count (CBC) 288  MCV 85  MCH 27.4  MCHC 32.5  RDW 14.5  Neutrophil % 82.7  Lymphocyte % 13.1  Monocyte % 3.8  Eosinophil % 0.2  Basophil % 0.2  Neutrophil # 6.1  Lymphocyte # 1.0  Monocyte # 0.3  Eosinophil # 0.0  Basophil # 0.0 (Result(s) reported on 12 Apr 2015 at 02:53AM.)   Radiology Results: CT:    20-Apr-16 06:33, CT Head Without Contrast  CT Head Without Contrast   REASON FOR EXAM:    status epilepticus  COMMENTS:       PROCEDURE: CT  - CT HEAD WITHOUT CONTRAST  - Apr 12 2015  6:33AM     CLINICAL DATA:  Status epilepticus.    EXAM:  CT HEAD WITHOUT CONTRAST    TECHNIQUE:  Contiguous axial images were obtained from the base of the skull  through the vertex without intravenous contrast.    COMPARISON:  01/09/2014, 02/24/2011  FINDINGS:  Extensive  subcortical and periventricular white matter  hypodensities, a nonspecific finding often seen in the setting of  chronic microangiopathic change. Right frontal lobe  encephalomalacia, similar to prior. Advanced cerebral and cerebellar  volume loss. Basilar cisterns patent. No overt hydrocephalus. No  definite CT evidence of an acute infarction. No intraparenchymal  hemorrhage, mass, mass effect, or abnormal extra-axial fluid  collection. 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  Atherosclerotic vascular  calcifications. The visualized paranasal  sinuses and mastoid air cells are predominantly clear.     IMPRESSION:  Advanced white matter changes, cerebral/cerebellar volume loss,  remote right frontal lobe infarction.  No definite CT evidence of an acute intracranial abnormality.      Electronically Signed    By: Carlos Levering M.D.    On: 04/12/2015 06:39         Verified By: Tommi Rumps, M.D.,   Radiology Impression: Radiology Impression: CT of head personally reviewed by me and shows moderate atrophy and white matter changes   Impression/Recommendations: Recommendations:   prior notes reviewed by me reviewed by me   Status epilepticus-  pt is at risk for further brain damage if these seizures still occur, he has an abnormal EEG with shows continued potential for seizures mainly out of the R frontal region.  He was very subtherapeutic on VPA.  Keppra level does not matter.   Moderate white matter changes-  this is likely focus for seizure agree with Dilantin load and then 134m q8h IV, level pending continue Keppra 1gm BID repeat EEG in am will need a MRI once stable start propofol for sedation treat any underlying infections keep Mg > 2, Ca > 8 and Na > 130 no driving or operating heavy machinery x 6 months please call for any seizure activity and if this continues pt will need to be transferred to facility with continuous EEG minutes of critical care time  spent, examining pt and coordinating care  Electronic Signatures: SJamison Neighbor(MD)  (Signed 20-Apr-16 14:50)  Authored: REFERRING PHYSICIAN, Primary Care Physician, Consult, History of Present Illness, Review of Systems, PAST MEDICAL/SURGICAL HISTORY, HOME MEDICATIONS, ALLERGIES, Social/Family History, NURSING VITAL SIGNS, Physical Exam-, LAB RESULTS, RADIOLOGY RESULTS, Recommendations   Last Updated: 20-Apr-16 14:50 by SJamison Neighbor(MD)

## 2015-04-23 NOTE — H&P (Signed)
PATIENT NAME:  [name], [name] MR#:  [id number] DATE OF BIRTH:  [birth year]  DATE OF ADMISSION:  04/12/2015  CHIEF COMPLAINT:  Seizures.   HISTORY OF PRESENT ILLNESS:  This is a 64 year old male who presented from his healthcare nursing facility with report of having seizures all day not responding to Ativan there, so he was sent here for admission and evaluation. The patient at the time of interview is unresponsive and unable to provide any history himself. Per chart review, he has a long history of seizure disorder and has been admitted in the past for seizure activity a significant number of times. He was given Ativan in the ED as well as Keppra with apparent cessation of seizure activity. Hospitalists were called for admission. On interview, the patient here by this writer he was nonresponsive and had very coarse breathing and was not protecting his airway. ED staff came in soon thereafter and intubated the patient, and he was admitted to CCU status.   PRIMARY CARE PHYSICIAN:  Nonlocal.   PAST MEDICAL HISTORY:  Seizures, dysphagia, anemia, diabetes, stroke, CHF, CAD.   MEDICATIONS:  Latanoprost ophthalmic 0.005% one drop in the left eye once a day, vitamin D3 at 50,000 units weekly, Spiriva 2 capsules inhaled per day 18 mcg capsules, QVAR 40 mcg/inhalation 2 puffs b.i.d., ProAir 2 puffs q. 4 hours p.r.n., pantoprazole 40 mg b.i.d., metoprolol tartrate 12.5 mg b.i.d., levothyroxine 175 mcg daily, Keppra 750 mg b.i.d., donepezil 10 mg daily, Diocto 5 mg daily, Depakote 250 mg b.i.d., Plavix 75 mg daily, Lipitor 40 mg daily, Align 4 mg daily.   PAST SURGICAL HISTORY:  Reported only as G-tube placement.   ALLERGIES:  No known drug allergies per chart review.   FAMILY HISTORY:  Unable to obtain due to the patient's mental status.   SOCIAL HISTORY:  Unable to obtain due to patient's mental status. He is presumed a nonsmoker and nondrinker with no illicit drug use since he is a nursing facility patient.    REVIEW OF SYSTEMS:  Unable to obtain due to the patient's mental status, persistent seizure activity reported by nursing facility staff.   PHYSICAL EXAMINATION: VITAL SIGNS:  Blood pressure 194/84, pulse 109, temperature 98.7, respirations 31 with 96% O2 saturations and that was on a nonrebreather at 15 liters of supplemental O2.  GENERAL:  This is a cachectic, very ill-appearing gentleman lying in bed in respiratory distress with very coarse breathing, nonresponsive.  HEENT:  Right pupil is not visible due to apparent cataract. Left pupil was not reactive to light. Extraocular movements are not intact. The patient is not responsive. The patient has moist mucosal membranes.  NECK:  No goiter. Neck is supple. No masses. No cervical adenopathy. No JVD.  RESPIRATORY:  Coarse breath sounds throughout lung fields, worse in the right lung base. No wheezing. No rales. Significant respiratory distress requiring intubation.  CARDIOVASCULAR:  Tachycardic. The patient has a very distinct S3 gallop. No significant lower extremity edema.  ABDOMEN:  Soft, nondistended. Unable to assess tenderness of the abdomen due to the patient's lack of response and no response to painful stimuli. He does have bowel sounds. PEG tube in place with no signs of infection.  MUSCULOSKELETAL:  Unable to assess extremity muscular strength. The patient does not have cyanosis or clubbing. Unable to assess range of motion.  SKIN:  No rash or lesions seen. No erythema. Skin is warm, dry, and intact.  LYMPHATIC:  No adenopathy.  NEUROLOGICAL:  Unable to assess due to  the patient's mental status. The patient does not withdraw to painful stimuli. His visible pupil in his left eye is not reactive; it is round, but it is not reactive to light. The patient did have a gag reflex on intubation.  PSYCHIATRIC:  The patient is unresponsive. Unable to fully assess psychiatric situation due to his mental status.   LABORATORY DATA:  White count is  7.4, hemoglobin 12.8, hematocrit 39.5, and platelets are 288,000. Sodium is 136, potassium 4.2, chloride 102, bicarbonate 29, BUN 51, creatinine 1.98, glucose 140, calcium 8.9, total protein 7.9, albumin 3.4, total bilirubin 0.2, alkaline phosphatase 161, AST 40, and ALT is 32. Urine toxicology screen is negative. UA is also negative.   RADIOLOGIC DATA:  Chest x-ray showed mild left lower lobe opacity favored to reflect scarring. Mild aspiration or pneumonia was not excluded.   ASSESSMENT AND PLAN: 1.  Persistent seizures. Question of status epilepticus in this case. The patient was given Keppra in the ED with some resolution of apparent seizure activity; however, he is not responsive afterwards. It is unclear exactly what his baseline functional status is, as on chart review, multiple prior admissions list him as unable to provide information such as review of systems or family history due to altered mental status on his admissions. We will also add loading dose of Dilantin, and we will keep him on Keppra and Dilantin at this time as antiepileptics. We will get a stat head CT. The patient is currently intubated, and he is admitted in CCU status. We will get a neurological consult and an EEG.  2.  Aspiration pneumonia. The patient was given antibiotics in the Emergency Department. We will continue antibiotics for this problem. We will obtain sputum and blood cultures.  3.  Diabetes mellitus. We will get fingerstick blood sugars on this patient q. 6 hours given a recent glucose of 140. We will order sliding scale insulin if needed based on his fingerstick values.  4.  Coronary artery disease. We will hold his Plavix for now until we get the head CT ruling out any kind of intracranial bleed. We will hold his other p.o. meds for now, as the patient is unable to protect his airway. These can be reinitiated at a later time if needed.  5.  Congestive heart failure. The patient's blood pressure was initially  elevated, and then after intubation, it was pretty low. It has come back up to a more reasonable level. We will hold on fluids for now. If he does require fluids at any point for his blood pressure, we will have to do so gently due to his heart failure. He has a very prominent S3 gallop at this time likely due to his heart failure. We are holding p.o. meds for now, but these can be restarted at a future time when needed.  6.  Deep vein thrombosis prophylaxis will be held at this time given unknown potential head bleed situation.   CODE STATUS:  Currently full code per documentation from the nursing facility. Attempts have been made to contact the patient's daughter to have a discussion about code status; however, we have been unable to reach her at this time. This question will need to be clarified, as the patient is significantly critically ill.   TIME SPENT ON THIS ADMISSION:  55 minutes.    ____________________________ Candace Cruise. Anne Hahn, MD dfw:[id number] D: 04/12/2015 04:31:28 ET T: 04/12/2015 05:07:27 ET JOB#: [id number]  cc: Candace Cruise. Anne Hahn, MD, <Dictator> Fortune Torosian Scotty Court MD  ELECTRONICALLY SIGNED 04/12/2015 10:13

## 2015-04-23 NOTE — Discharge Summary (Signed)
Dates of Admission and Diagnosis:  Date of Admission 12-Apr-2015   Date of Discharge 13-Apr-2015   Admitting Diagnosis status epilapticus   Final Diagnosis status epilapticus- respi failure due to that. Hypotension on presention- due to medications- resolved. Ac renal failure- monitor. Seizure disorder Hx of CVA    Chief Complaint/History of Present Illness a 64 year old male who presented from his healthcare nursing facility with report of having seizures all day not responding to Ativan there, so he was sent here for admission and evaluation. The patient at the time of interview is unresponsive and unable to provide any history himself. Per chart review, he has a long history of seizure disorder and has been admitted in the past for seizure activity a significant number of times. He was given Ativan in the ED as well as Keppra with apparent cessation of seizure activity. Hospitalists were called for admission. On interview, the patient here by this writer he was nonresponsive and had very coarse breathing and was not protecting his airway. ED staff came in soon thereafter and intubated the patient, and he was admitted to CCU status.   Allergies:  No Known Allergies:   Hepatic:  20-Apr-16 02:03   Bilirubin, Total  0.2 (0.3-1.2 NOTE: New Reference Range  02/28/15)  Alkaline Phosphatase  161 (38-126 NOTE: New Reference Range  02/28/15)  SGPT (ALT) 32 (17-63 NOTE: New Reference Range  02/28/15)  SGOT (AST) 40 (15-41 NOTE: New Reference Range  02/28/15)  Total Protein, Serum 7.9 (6.5-8.1 NOTE: New Reference Range  02/28/15)  Albumin, Serum  3.4 (3.5-5.0 NOTE: New reference range  02/28/15)  TDMs:  20-Apr-16 02:03   Valproic Acid, Serum <10.0 (Result(s) reported on 12 Apr 2015 at 11:15AM.)  21-Apr-16 04:50   Dilantin, Serum 11.2 (10.0-20.0 NOTE: New Reference Range:  02/28/15)  Routine Micro:  20-Apr-16 06:17   Micro Text Report SPUTUM CULTURE   COMMENT                    HOLDING FOR POSSIBLE PATHOGEN   ANTIBIOTIC                       Specimen Source NASAL TRACHEAL  Culture Comment HOLDING FOR POSSIBLE PATHOGEN  Result(s) reported on 13 Apr 2015 at 08:15AM.  Cardiology:  20-Apr-16 18:16   Echo Doppler REASON FOR EXAM:     COMMENTS:     PROCEDURE: [place] - ECHO DOPPLER COMPLETE(TRANSTHOR)  - Apr 12 2015  6:16PM   RESULT: Echocardiogram Report  Patient Name:   [name] Date of Exam: 04/12/2015 Medical Rec #:  [id number]        Custom1: Date ofBirth:  Oct 15, 1951    Height:       71.0 in Patient Age:    68 years      Weight:       151.0 lb Patient Gender: M             BSA:          1.87 m??  Indications: Respiratory Failure Sonographer:    Cindy Hazy RDCS Referring Phys: Lance Coon, F  Summary:  1. Left ventricular ejection fraction, by visual estimation, is 55 to  60%.  2. Normal global left ventricular systolic function.  3. Impaired relaxation pattern of LV diastolic filling.  4. Mild concentric left ventricularhypertrophy.  5. Mild aortic valve sclerosis without stenosis.  6. Mild tricuspid regurgitation. 2D AND M-MODE MEASUREMENTS (normal ranges within parentheses): Left Ventricle:  Normal IVSd (2D):      1.34 cm (0.7-1.1) LVPWd (2D):     1.15 cm (0.7-1.1) Aorta/LA:                  Normal LVIDd (2D):     3.46 cm (3.4-5.7) Aortic Root (2D): 3.30 cm (2.4-3.7) LVIDs (2D):     2.48 cm           Left Atrium (2D): 2.60 cm (1.9-4.0) LV FS (2D):     28.3 %   (>25%) LV EF (2D):     55.8 %   (>50%)                                   Right Ventricle:                                   RVd (2D): LV DIASTOLIC FUNCTION: MV Peak E: 0.52 m/s E/e' Ratio: 7.10 MV Peak A: 0.69 m/s Decel Time: 225 msec E/A Ratio: 0.74 SPECTRAL DOPPLER ANALYSIS(where applicable): Mitral Valve: MV P1/2 Time: 65.25 msec MV Area, PHT: 3.37 cm?? LVOT Vmax: 0.76 m/s LVOT VTI: 0.117 m LVOT Diameter: 2.00 cm Tricuspid Valve and PA/RV Systolic Pressure: TR Max  Velocity: 1.97 m/s RA   Pressure:  RVSP/PASP:  PHYSICIAN INTERPRETATION: Left Ventricle: The left ventricular internal cavity size was normal.  Mild concentric left ventricular hypertrophy. Global LV systolic function  was normal. Left ventricular ejection fraction, by visual estimation, is  55 to 60%. Spectral Doppler shows impaired relaxation pattern of LV  diastolic filling. Right Ventricle: Normal right ventricular size, wall thickness, and  systolic function. Left Atrium: The left atrium is normal in size and structure. Right Atrium: The right atrium is normal in size and structure. Pericardium: There is no evidence of pericardial effusion. Mitral Valve: The mitral valve is normal in structure. No evidence of  mitral valve stenosis. Trace mitral valve regurgitation is seen. Tricuspid Valve: The tricuspid valve is normal. Mild tricuspid  regurgitation is visualized. Aortic Valve: The aortic valve is tricuspid. Mild aortic valve sclerosis  is present, with no evidence of aortic valve stenosis. No evidence of  aortic valve regurgitation is seen. Pulmonic Valve: The pulmonic valve is not well seen. No indication of  pulmonic valve regurgitation. Aorta: The aortic root is normal in size and structure. Venous: The inferior vena cava was normal. The inferior vena cava was  normal sized.  [id number] Kathlyn Sacramento MD Electronically signed by [id number] Kathlyn Sacramento MD Signature Date/Time: 04/13/2015/7:52:28 AM *** Final ***  IMPRESSION: .    Verified By: Mertie Clause. ARIDA, M.D., MD  Routine Chem:  20-Apr-16 02:03   Glucose, Serum  140 (65-99 NOTE: New Reference Range  02/28/15)  BUN  51 (6-20 NOTE: New Reference Range  02/28/15)  Creatinine (comp)  1.98 (0.61-1.24 NOTE: New Reference Range  02/28/15)  Sodium, Serum 136 (135-145 NOTE: New Reference Range  02/28/15)  Potassium, Serum 4.2 (3.5-5.1 NOTE: New Reference Range  02/28/15)  Chloride, Serum 102 (101-111 NOTE: New  Reference Range  02/28/15)  CO2, Serum 29 (22-32 NOTE: New Reference Range  02/28/15)  Calcium (Total), Serum 8.9 (8.9-10.3 NOTE: New Reference Range  02/28/15)  Anion Gap  5  eGFR (African American)  41  eGFR (Non-African American)  35 (eGFR values <58m/min/1.73 m2 may be an indication of chronic kidney disease (  CKD). Calculated eGFR is useful in patients with stable renal function. The eGFR calculation will not be reliable in acutely ill patients when serum creatinine is changing rapidly. It is not useful in patients on dialysis. The eGFR calculation may not be applicable to patients at the low and high extremes of body sizes, pregnant women, and vegetarians.)  Magnesium, Serum 2.4 (1.7-2.4 THERAPEUTIC RANGE: 4-7 mg/dL TOXIC: > 10 mg/dL  ----------------------- NOTE: New Reference Range  02/28/15)  Ethanol, S. <5 (0-0 NOTE: New Reference Range:  02/28/15)  Urine Drugs:  59-DJT-[id number] 17:79   Tricyclic Antidepressant, Ur Qual (comp) NEGATIVE (Result(s) reported on 12 Apr 2015 at 05:05AM.)  Amphetamines, Urine Qual. NEGATIVE  MDMA, Urine Qual. NEGATIVE  Cocaine Metabolite, Urine Qual. NEGATIVE  Opiate, Urine qual NEGATIVE  Phencyclidine, Urine Qual. NEGATIVE  Cannabinoid, Urine Qual. NEGATIVE  Barbiturates, Urine Qual. NEGATIVE  Benzodiazepine, Urine Qual. NEGATIVE (------------------ The URINE DRUG SCREEN provides only a preliminary, unconfirmed analytical test result and should not be used for non-medical purposes.  Clinical consideration and professional judgment should be applied to any positive drug screen result due to possible interfering substances.  A more specific alternate chemical method must be used in order to obtain a confirmed analytical result.  Gas chromatography/mass spectrometry (GC/MS) is the preferred confirmatory method.)  Methadone, Urine Qual. NEGATIVE  Cardiac:  20-Apr-16 02:03   CK, Total  518 (39-030 NOTE: New Reference Range  02/28/15)     06:17   Troponin I 0.03 (0.00-0.03 0.03 ng/mL or less: NEGATIVE  Repeat testing in 3-6 hrs  if clinically indicated. >0.05 ng/mL: POTENTIAL  MYOCARDIAL INJURY. Repeat  testing in 3-6 hrs if  clinically indicated. NOTE: An increase or decrease  of 30% or more on serial  testing suggests a  clinically important change NOTE: New Reference Range  02/28/15)    14:13   Troponin I  0.07 (0.00-0.03 0.03 ng/mL or less: NEGATIVE  Repeat testing in 3-6 hrs  if clinically indicated. >0.05 ng/mL: POTENTIAL  MYOCARDIAL INJURY. Repeat  testing in 3-6 hrs if  clinically indicated. NOTE: An increase or decrease  of 30% or more on serial  testing suggests a  clinically important change NOTE: New Reference Range  02/28/15)    17:19   Troponin I  0.06 (0.00-0.03 0.03 ng/mL or less: NEGATIVE  Repeat testing in 3-6 hrs  if clinically indicated. >0.05 ng/mL: POTENTIAL  MYOCARDIAL INJURY. Repeat  testing in 3-6 hrs if  clinically indicated. NOTE: An increase or decrease  of 30% or more on serial  testing suggests a  clinically important change NOTE: New Reference Range  02/28/15)  Routine UA:  20-Apr-16 02:03   Color (UA) Yellow  Clarity (UA) Clear  Glucose (UA) 50 mg/dL  Bilirubin (UA) Negative  Ketones (UA) Negative  Specific Gravity (UA) 1.014  Blood (UA) 1+  pH (UA) 6.0  Protein (UA) >=500  Nitrite (UA) Negative  Leukocyte Esterase (UA) Negative (Result(s) reported on 12 Apr 2015 at 02:52AM.)  RBC (UA) 0-5  WBC (UA) 0-5  Bacteria (UA) RARE  Epithelial Cells (UA) NONE SEEN  Mucous (UA) PRESENT  Hyaline Cast (UA) PRESENT (Result(s) reported on 12 Apr 2015 at 02:52AM.)  Routine Hem:  20-Apr-16 02:03   WBC (CBC) 7.4  RBC (CBC) 4.68  Hemoglobin (CBC)  12.8  Hematocrit (CBC)  39.5  Platelet Count (CBC) 288  MCV 85  MCH 27.4  MCHC 32.5  RDW 14.5  Neutrophil % 82.7  Lymphocyte % 13.1  Monocyte % 3.8  Eosinophil % 0.2  Basophil % 0.2  Neutrophil # 6.1  Lymphocyte #  1.0  Monocyte # 0.3  Eosinophil # 0.0  Basophil # 0.0 (Result(s) reported on 12 Apr 2015 at 02:53AM.)   PERTINENT RADIOLOGY STUDIES: LabUnknown:    20-Apr-16 06:33, CT Head Without Contrast  PACS Image   CT:  CT Head Without Contrast   REASON FOR EXAM:    status epilepticus  COMMENTS:       PROCEDURE: CT  - CT HEAD WITHOUT CONTRAST  - Apr 12 2015  6:33AM     CLINICAL DATA:  Status epilepticus.    EXAM:  CT HEAD WITHOUT CONTRAST    TECHNIQUE:  Contiguous axial images were obtained from the base of the skull  through the vertex without intravenous contrast.    COMPARISON:  01/09/2014, 02/24/2011  FINDINGS:  Extensive subcortical and periventricular white matter  hypodensities, a nonspecific finding often seen in the setting of  chronic microangiopathic change. Right frontal lobe  encephalomalacia, similar to prior. Advanced cerebral and cerebellar  volume loss. Basilar cisterns patent. No overt hydrocephalus. No  definite CT evidence of an acute infarction. No intraparenchymal  hemorrhage, mass, mass effect, or abnormal extra-axial fluid  collection. 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  Atherosclerotic vascular calcifications. The visualized paranasal  sinuses and mastoid air cells are predominantly clear.     IMPRESSION:  Advanced white matter changes, cerebral/cerebellar volume loss,  remote right frontal lobe infarction.  No definite CT evidence of an acute intracranial abnormality.      Electronically Signed    By: Carlos Levering M.D.    On: 04/12/2015 06:39         Verified By: Tommi Rumps, M.D.,   Pertinent Past History:  Pertinent Past History Seizures, dysphagia, anemia, diabetes, stroke, CHF, CAD.   PAST SURGICAL HISTORY:  Reported only as G-tube placement.   ALLERGIES:  No known drug allergies per chart review.   FAMILY HISTORY:  Unable to obtain due to the patient's mental status.   SOCIAL HISTORY:  Unable to obtain due  to patient's mental status. He is presumed a nonsmoker and nondrinker with no illicit drug use since he is a nursing facility patient.   Hospital Course:  Hospital Course 1. acute respiratory fauilure,. contineru MV, pulmonary to help with management , no onvious aspiartion pneumonitis at repsent, watchiing carefully, sedated with diprivan, 2. Shock to sedating meds, incuding diprivan/fentanyl, versed, now s/p RIJ placement ,was  on pressors keeping MAP>65., off now- does not need pressors now. 3. status epilepticus, likely due to low levels of valproic acid, advance depakote to tid IV, continue high dose of keppra and dilantin , added vimpat- appreciate DR Tamala Julian input, EEG is worsening, multiple dischages, epileptic activity,   As per Neuro- need continuous EEG monitor- at tertiary care- acceepted at [facility]. 4. acute on chronic renal failure, IVF at high rate, folwoing CR in am 5. elevated troponin, likley demand ischemia, troponin on follow up stable.   Condition on Discharge Stable   Code Status:  Code Status Full Code   DISCHARGE INSTRUCTIONS HOME MEDS:  Medication Reconciliation: Patient's Home Medications at Discharge:     Medication Instructions  spiriva 18 mcg inhalation capsule  2 each inhaled once a day   levetiracetam  750 milligram(s) intravenous every 12 hours   levothyroxine 175 mcg (0.175 mg) oral tablet  1 tab(s) orally once a day   phenytoin 50 mg/ml injectable solution  100 milligram(s)  injectable 3 times a day   valproic acid  500 milligram(s) intravenous every 12 hours   lacosamide  100 milligram(s) intravenous every 12 hours   aspirin  324 milligram(s) orally once a day- via gastric tube   labetalol  10 milligram(s) injectable every 4 hours, As needed, elevated BP   propofol  30 mg/hr intravenous once a day- continuous drip,.   budesonide 0.5 mg/2 ml inhalation suspension  3 milliliter(s) inhaled 2 times a day    STOP TAKING THE FOLLOWING MEDICATION(S):     latanoprost ophthalmic 0.005% ophthalmic solution: 1 drop(s) in left eye once a day (at bedtime) qvar 40 mcg/inh inhalation aerosol: 2 puff(s) inhaled 2 times a day align 4 mg oral capsule: 1 cap(s) enteral once a day atorvastatin 40 mg oral tablet: 1 tab(s) enteral once a day (in the morning) diocto 10 mg/ml oral liquid: 5 milliliter(s) enteral once a day donepezil 10 mg oral tablet: 1 tab(s) enteral once a day (at bedtime) vitamin d3 50,000 intl units oral capsule: 1 cap(s) enteral once a month on the 6th of each month metoprolol tartrate 25 mg oral tablet: 0.5 tab(s) enteral 2 times a day pantoprazole 40 mg oral granule, enteric coated: 1 packet(s) enteral 2 times a day glucagen hypokit recombinant 1 mg injectable powder for injection: 1 milligram(s) subcutaneous once, As Needed for hypoglycemia proair hfa cfc free 90 mcg/inh inhalation aerosol: 2 puff(s) inhaled every 4 hours, As Needed - for Wheezing clopidogrel 75 mg oral tablet: 1 tab(s) enteral once a dayFROM 10/05/14 senna 8.6 mg oral tablet: 1 tab(s) orally once a day (at bedtime) albuterol 2.5 mg/3 ml (0.083%) inhalation solution: 3 milliliter(s) inhaled every 6 hours lorazepam 2 mg/ml injectable solution: 1 milliliter(s) injectable now. STAT.  dulcolax laxative 10 mg rectal suppository: 1 suppository(ies) rectal once a day, As Needed - for Constipation rocephin 1 g injectable powder for injection: 2 gram(s) injectable once for pheumonia. acetaminophen 325 mg oral tablet: 2 tab(s) orally every 4 hours  Physician's Instructions:  Diet Regular   Activity Limitations None   Return to Work Not Applicable   Time frame for Follow Up Appointment 1-2 days   Other Comments Being transferred to [facility] ICU for further care.   Electronic Signatures: Vaughan Basta (MD)  (Signed 21-Apr-16 15:45)  Authored: ADMISSION DATE AND DIAGNOSIS, CHIEF COMPLAINT/HPI, Allergies, PERTINENT LABS, PERTINENT RADIOLOGY STUDIES, PERTINENT PAST  HISTORY, HOSPITAL COURSE, DISCHARGE INSTRUCTIONS HOME MEDS, PATIENT INSTRUCTIONS   Last Updated: 21-Apr-16 15:45 by Vaughan Basta (MD)

## 2015-07-26 ENCOUNTER — Emergency Department: Payer: Medicare Other

## 2015-07-26 ENCOUNTER — Encounter: Payer: Self-pay | Admitting: Emergency Medicine

## 2015-07-26 ENCOUNTER — Inpatient Hospital Stay
Admission: EM | Admit: 2015-07-26 | Discharge: 2015-08-01 | DRG: 871 | Disposition: A | Discharge status: Skilled Nursing Facility | Payer: Medicare Other | Attending: Internal Medicine | Admitting: Internal Medicine

## 2015-07-26 DIAGNOSIS — J449 Chronic obstructive pulmonary disease, unspecified: Secondary | ICD-10-CM | POA: Diagnosis present

## 2015-07-26 DIAGNOSIS — R4182 Altered mental status, unspecified: Secondary | ICD-10-CM

## 2015-07-26 DIAGNOSIS — E873 Alkalosis: Secondary | ICD-10-CM | POA: Diagnosis present

## 2015-07-26 DIAGNOSIS — I493 Ventricular premature depolarization: Secondary | ICD-10-CM | POA: Diagnosis present

## 2015-07-26 DIAGNOSIS — N183 Chronic kidney disease, stage 3 (moderate): Secondary | ICD-10-CM | POA: Diagnosis present

## 2015-07-26 DIAGNOSIS — E871 Hypo-osmolality and hyponatremia: Secondary | ICD-10-CM | POA: Diagnosis present

## 2015-07-26 DIAGNOSIS — N189 Chronic kidney disease, unspecified: Secondary | ICD-10-CM

## 2015-07-26 DIAGNOSIS — N170 Acute kidney failure with tubular necrosis: Secondary | ICD-10-CM | POA: Diagnosis present

## 2015-07-26 DIAGNOSIS — N179 Acute kidney failure, unspecified: Secondary | ICD-10-CM | POA: Diagnosis not present

## 2015-07-26 DIAGNOSIS — R008 Other abnormalities of heart beat: Secondary | ICD-10-CM | POA: Diagnosis present

## 2015-07-26 DIAGNOSIS — E875 Hyperkalemia: Secondary | ICD-10-CM | POA: Diagnosis present

## 2015-07-26 DIAGNOSIS — I129 Hypertensive chronic kidney disease with stage 1 through stage 4 chronic kidney disease, or unspecified chronic kidney disease: Secondary | ICD-10-CM | POA: Diagnosis present

## 2015-07-26 DIAGNOSIS — E860 Dehydration: Secondary | ICD-10-CM | POA: Diagnosis present

## 2015-07-26 DIAGNOSIS — Z22322 Carrier or suspected carrier of Methicillin resistant Staphylococcus aureus: Secondary | ICD-10-CM

## 2015-07-26 DIAGNOSIS — Z9911 Dependence on respirator [ventilator] status: Secondary | ICD-10-CM

## 2015-07-26 DIAGNOSIS — I69354 Hemiplegia and hemiparesis following cerebral infarction affecting left non-dominant side: Secondary | ICD-10-CM

## 2015-07-26 DIAGNOSIS — J9690 Respiratory failure, unspecified, unspecified whether with hypoxia or hypercapnia: Secondary | ICD-10-CM

## 2015-07-26 DIAGNOSIS — Z681 Body mass index (BMI) 19 or less, adult: Secondary | ICD-10-CM | POA: Diagnosis not present

## 2015-07-26 DIAGNOSIS — R739 Hyperglycemia, unspecified: Secondary | ICD-10-CM | POA: Diagnosis present

## 2015-07-26 DIAGNOSIS — Z79899 Other long term (current) drug therapy: Secondary | ICD-10-CM | POA: Diagnosis not present

## 2015-07-26 DIAGNOSIS — G40909 Epilepsy, unspecified, not intractable, without status epilepticus: Secondary | ICD-10-CM | POA: Diagnosis present

## 2015-07-26 DIAGNOSIS — J690 Pneumonitis due to inhalation of food and vomit: Secondary | ICD-10-CM | POA: Diagnosis present

## 2015-07-26 DIAGNOSIS — R748 Abnormal levels of other serum enzymes: Secondary | ICD-10-CM | POA: Diagnosis present

## 2015-07-26 DIAGNOSIS — Z87891 Personal history of nicotine dependence: Secondary | ICD-10-CM

## 2015-07-26 DIAGNOSIS — E162 Hypoglycemia, unspecified: Secondary | ICD-10-CM | POA: Diagnosis present

## 2015-07-26 DIAGNOSIS — F0390 Unspecified dementia without behavioral disturbance: Secondary | ICD-10-CM | POA: Diagnosis present

## 2015-07-26 DIAGNOSIS — E039 Hypothyroidism, unspecified: Secondary | ICD-10-CM | POA: Diagnosis present

## 2015-07-26 DIAGNOSIS — J9601 Acute respiratory failure with hypoxia: Secondary | ICD-10-CM | POA: Diagnosis present

## 2015-07-26 DIAGNOSIS — E11649 Type 2 diabetes mellitus with hypoglycemia without coma: Secondary | ICD-10-CM | POA: Diagnosis present

## 2015-07-26 DIAGNOSIS — R1310 Dysphagia, unspecified: Secondary | ICD-10-CM | POA: Diagnosis present

## 2015-07-26 DIAGNOSIS — E46 Unspecified protein-calorie malnutrition: Secondary | ICD-10-CM | POA: Diagnosis present

## 2015-07-26 DIAGNOSIS — A419 Sepsis, unspecified organism: Secondary | ICD-10-CM | POA: Diagnosis present

## 2015-07-26 DIAGNOSIS — J9600 Acute respiratory failure, unspecified whether with hypoxia or hypercapnia: Secondary | ICD-10-CM

## 2015-07-26 DIAGNOSIS — R0602 Shortness of breath: Secondary | ICD-10-CM

## 2015-07-26 DIAGNOSIS — J988 Other specified respiratory disorders: Secondary | ICD-10-CM

## 2015-07-26 DIAGNOSIS — E785 Hyperlipidemia, unspecified: Secondary | ICD-10-CM | POA: Diagnosis present

## 2015-07-26 DIAGNOSIS — R401 Stupor: Secondary | ICD-10-CM | POA: Diagnosis not present

## 2015-07-26 DIAGNOSIS — L8990 Pressure ulcer of unspecified site, unspecified stage: Secondary | ICD-10-CM | POA: Diagnosis present

## 2015-07-26 HISTORY — DX: Unspecified dementia, unspecified severity, without behavioral disturbance, psychotic disturbance, mood disturbance, and anxiety: F03.90

## 2015-07-26 HISTORY — DX: Hyperkalemia: E87.5

## 2015-07-26 HISTORY — DX: Hemiplegia, unspecified affecting unspecified side: G81.90

## 2015-07-26 HISTORY — DX: Gastrostomy status: Z93.1

## 2015-07-26 HISTORY — DX: Urinary tract infection, site not specified: N39.0

## 2015-07-26 HISTORY — DX: Chronic obstructive pulmonary disease, unspecified: J44.9

## 2015-07-26 HISTORY — DX: Dysphagia, unspecified: R13.10

## 2015-07-26 HISTORY — DX: Cerebral infarction, unspecified: I63.9

## 2015-07-26 HISTORY — DX: Muscle weakness (generalized): M62.81

## 2015-07-26 LAB — URINALYSIS COMPLETE WITH MICROSCOPIC (ARMC ONLY)
Bilirubin Urine: NEGATIVE
Glucose, UA: 50 mg/dL — AB
Ketones, ur: NEGATIVE mg/dL
Nitrite: NEGATIVE
Protein, ur: 100 mg/dL — AB
Specific Gravity, Urine: 1.008 (ref 1.005–1.030)
pH: 5 (ref 5.0–8.0)

## 2015-07-26 LAB — TROPONIN I
TROPONIN I: 0.05 ng/mL — AB (ref <0.031)
Troponin I: 0.04 ng/mL — ABNORMAL HIGH (ref <0.031)
Troponin I: 0.03 ng/mL (ref <0.031)
Troponin I: 0.03 ng/mL (ref <0.031)

## 2015-07-26 LAB — CBC
HEMATOCRIT: 31.3 % — AB (ref 40.0–52.0)
HEMOGLOBIN: 10.2 g/dL — AB (ref 13.0–18.0)
MCH: 27.5 pg (ref 26.0–34.0)
MCHC: 32.7 g/dL (ref 32.0–36.0)
MCV: 84.1 fL (ref 80.0–100.0)
Platelets: 223 10*3/uL (ref 150–440)
RBC: 3.72 MIL/uL — ABNORMAL LOW (ref 4.40–5.90)
RDW: 15.6 % — ABNORMAL HIGH (ref 11.5–14.5)
WBC: 13.9 10*3/uL — ABNORMAL HIGH (ref 3.8–10.6)

## 2015-07-26 LAB — COMPREHENSIVE METABOLIC PANEL
ALK PHOS: 85 U/L (ref 38–126)
ALT: 12 U/L — AB (ref 17–63)
AST: 22 U/L (ref 15–41)
Albumin: 1.9 g/dL — ABNORMAL LOW (ref 3.5–5.0)
Anion gap: 10 (ref 5–15)
BUN: 46 mg/dL — ABNORMAL HIGH (ref 6–20)
CALCIUM: 7.6 mg/dL — AB (ref 8.9–10.3)
CHLORIDE: 91 mmol/L — AB (ref 101–111)
CO2: 26 mmol/L (ref 22–32)
Creatinine, Ser: 2.44 mg/dL — ABNORMAL HIGH (ref 0.61–1.24)
GFR calc non Af Amer: 27 mL/min — ABNORMAL LOW (ref >60)
GFR, EST AFRICAN AMERICAN: 31 mL/min — AB (ref >60)
GLUCOSE: 310 mg/dL — AB (ref 65–99)
POTASSIUM: 4.5 mmol/L (ref 3.5–5.1)
SODIUM: 127 mmol/L — AB (ref 135–145)
TOTAL PROTEIN: 6.2 g/dL — AB (ref 6.5–8.1)
Total Bilirubin: <0.1 mg/dL — ABNORMAL LOW (ref 0.3–1.2)

## 2015-07-26 LAB — GLUCOSE, CAPILLARY
Glucose-Capillary: 121 mg/dL — ABNORMAL HIGH (ref 65–99)
Glucose-Capillary: 199 mg/dL — ABNORMAL HIGH (ref 65–99)
Glucose-Capillary: 234 mg/dL — ABNORMAL HIGH (ref 65–99)
Glucose-Capillary: 269 mg/dL — ABNORMAL HIGH (ref 65–99)
Glucose-Capillary: 313 mg/dL — ABNORMAL HIGH (ref 65–99)

## 2015-07-26 LAB — MRSA PCR SCREENING: MRSA by PCR: POSITIVE — AB

## 2015-07-26 LAB — BLOOD GAS, ARTERIAL
Acid-Base Excess: 4.4 mmol/L — ABNORMAL HIGH (ref 0.0–3.0)
Allens test (pass/fail): POSITIVE — AB
BICARBONATE: 26.7 meq/L (ref 21.0–28.0)
FIO2: 0.4
Mechanical Rate: 16
O2 Saturation: 95.7 %
PCO2 ART: 32 mmHg (ref 32.0–48.0)
PEEP: 5 cmH2O
Patient temperature: 37
RATE: 16 resp/min
VT: 450 mL
pH, Arterial: 7.53 — ABNORMAL HIGH (ref 7.350–7.450)
pO2, Arterial: 70 mmHg — ABNORMAL LOW (ref 83.0–108.0)

## 2015-07-26 LAB — LACTIC ACID, PLASMA: Lactic Acid, Venous: 1.6 mmol/L (ref 0.5–2.0)

## 2015-07-26 LAB — HEMOGLOBIN A1C: Hgb A1c MFr Bld: 6.8 % — ABNORMAL HIGH (ref 4.0–6.0)

## 2015-07-26 LAB — TSH: TSH: 0.617 u[IU]/mL (ref 0.350–4.500)

## 2015-07-26 MED ORDER — PHENYLEPH-SHARK LIV OIL-MO-PET 0.25-3-14-71.9 % RE OINT
1.0000 "application " | TOPICAL_OINTMENT | Freq: Two times a day (BID) | RECTAL | Status: DC | PRN
  Filled 2015-07-26: qty 28.4

## 2015-07-26 MED ORDER — SODIUM CHLORIDE 0.9 % IV BOLUS (SEPSIS)
1000.0000 mL | Freq: Once | INTRAVENOUS | Status: AC
  Administered 2015-07-26: 1000 mL via INTRAVENOUS

## 2015-07-26 MED ORDER — PIPERACILLIN-TAZOBACTAM 4.5 G IVPB
4.5000 g | Freq: Three times a day (TID) | INTRAVENOUS | Status: DC
  Administered 2015-07-26 – 2015-07-27 (×3): 4.5 g via INTRAVENOUS
  Filled 2015-07-26 (×7): qty 100

## 2015-07-26 MED ORDER — JEVITY 1.5 CAL/FIBER PO LIQD
1000.0000 mL | ORAL | Status: DC
  Administered 2015-07-26: 1000 mL

## 2015-07-26 MED ORDER — METOPROLOL TARTRATE 25 MG PO TABS
25.0000 mg | ORAL_TABLET | Freq: Two times a day (BID) | ORAL | Status: DC
  Administered 2015-07-26 – 2015-08-01 (×11): 25 mg
  Filled 2015-07-26 (×11): qty 1

## 2015-07-26 MED ORDER — ACETAMINOPHEN 650 MG RE SUPP
650.0000 mg | Freq: Three times a day (TID) | RECTAL | Status: DC | PRN
  Administered 2015-08-01: 14:00:00 650 mg via RECTAL
  Filled 2015-07-26: qty 1

## 2015-07-26 MED ORDER — FENTANYL CITRATE (PF) 100 MCG/2ML IJ SOLN
50.0000 ug | Freq: Once | INTRAMUSCULAR | Status: AC
  Administered 2015-07-26: 50 ug via INTRAVENOUS

## 2015-07-26 MED ORDER — VANCOMYCIN HCL IN DEXTROSE 750-5 MG/150ML-% IV SOLN
750.0000 mg | INTRAVENOUS | Status: AC
  Administered 2015-07-26: 750 mg via INTRAVENOUS
  Filled 2015-07-26: qty 150

## 2015-07-26 MED ORDER — VALPROATE SODIUM 250 MG/5ML PO SYRP: 750.0000 mg | ORAL_SOLUTION | Freq: Two times a day (BID) | ORAL | Status: DC

## 2015-07-26 MED ORDER — ONDANSETRON HCL 4 MG PO TABS: 4.0000 mg | ORAL_TABLET | Freq: Four times a day (QID) | ORAL | Status: DC | PRN

## 2015-07-26 MED ORDER — SCOPOLAMINE 1 MG/3DAYS TD PT72
1.0000 | MEDICATED_PATCH | TRANSDERMAL | Status: DC
  Administered 2015-07-26: 1.5 mg via TRANSDERMAL
  Filled 2015-07-26: qty 1

## 2015-07-26 MED ORDER — CHLORHEXIDINE GLUCONATE CLOTH 2 % EX PADS
6.0000 | MEDICATED_PAD | Freq: Every day | CUTANEOUS | Status: DC
  Administered 2015-07-26 – 2015-07-30 (×3): 6 via TOPICAL

## 2015-07-26 MED ORDER — LEVETIRACETAM 100 MG/ML PO SOLN
1000.0000 mg | Freq: Two times a day (BID) | ORAL | Status: DC
  Administered 2015-07-26 – 2015-08-01 (×13): 1000 mg
  Filled 2015-07-26 (×18): qty 10

## 2015-07-26 MED ORDER — IPRATROPIUM-ALBUTEROL 0.5-2.5 (3) MG/3ML IN SOLN: 3.0000 mL | Freq: Three times a day (TID) | RESPIRATORY_TRACT | Status: DC

## 2015-07-26 MED ORDER — HEPARIN SODIUM (PORCINE) 5000 UNIT/ML IJ SOLN
5000.0000 [IU] | Freq: Three times a day (TID) | INTRAMUSCULAR | Status: DC
  Administered 2015-07-26 – 2015-08-01 (×20): 5000 [IU] via SUBCUTANEOUS
  Filled 2015-07-26 (×20): qty 1

## 2015-07-26 MED ORDER — VALPROIC ACID 250 MG/5ML PO SYRP
1000.0000 mg | ORAL_SOLUTION | Freq: Every day | ORAL | Status: DC
  Administered 2015-07-26 – 2015-07-31 (×6): 1000 mg
  Filled 2015-07-26 (×8): qty 20

## 2015-07-26 MED ORDER — CHLORHEXIDINE GLUCONATE 0.12 % MT SOLN
15.0000 mL | Freq: Two times a day (BID) | OROMUCOSAL | Status: DC
  Administered 2015-07-26 (×2): 15 mL via OROMUCOSAL

## 2015-07-26 MED ORDER — JEVITY 1.5 CAL/FIBER PO LIQD
237.0000 mL | Freq: Every day | ORAL | Status: DC
  Administered 2015-07-26 (×2): 237 mL

## 2015-07-26 MED ORDER — VALPROIC ACID 250 MG/5ML PO SYRP
750.0000 mg | ORAL_SOLUTION | Freq: Every morning | ORAL | Status: DC
  Administered 2015-07-26 – 2015-08-01 (×7): 750 mg
  Filled 2015-07-26 (×7): qty 15

## 2015-07-26 MED ORDER — CETYLPYRIDINIUM CHLORIDE 0.05 % MT LIQD
7.0000 mL | Freq: Two times a day (BID) | OROMUCOSAL | Status: DC
  Administered 2015-07-26 – 2015-08-01 (×10): 7 mL via OROMUCOSAL

## 2015-07-26 MED ORDER — PROPOFOL 1000 MG/100ML IV EMUL
INTRAVENOUS | Status: AC
  Administered 2015-07-26: 5 ug/kg/min via INTRAVENOUS
  Filled 2015-07-26: qty 100

## 2015-07-26 MED ORDER — PIPERACILLIN-TAZOBACTAM 3.375 G IVPB
3.3750 g | Freq: Once | INTRAVENOUS | Status: AC
  Administered 2015-07-26: 3.375 g via INTRAVENOUS

## 2015-07-26 MED ORDER — BISACODYL 10 MG RE SUPP: 10.0000 mg | RECTAL | Status: DC | PRN

## 2015-07-26 MED ORDER — SUCCINYLCHOLINE CHLORIDE 20 MG/ML IJ SOLN
INTRAMUSCULAR | Status: AC | PRN
  Administered 2015-07-26: 100 mg via INTRAVENOUS

## 2015-07-26 MED ORDER — ONDANSETRON HCL 4 MG/2ML IJ SOLN: 4.0000 mg | Freq: Four times a day (QID) | INTRAMUSCULAR | Status: DC | PRN

## 2015-07-26 MED ORDER — FENTANYL BOLUS VIA INFUSION
50.0000 ug | INTRAVENOUS | Status: DC | PRN
  Filled 2015-07-26: qty 50

## 2015-07-26 MED ORDER — ATORVASTATIN CALCIUM 20 MG PO TABS
40.0000 mg | ORAL_TABLET | Freq: Every day | ORAL | Status: DC
  Administered 2015-07-26 – 2015-08-01 (×7): 40 mg
  Filled 2015-07-26 (×7): qty 2

## 2015-07-26 MED ORDER — CLOPIDOGREL BISULFATE 75 MG PO TABS
75.0000 mg | ORAL_TABLET | Freq: Every day | ORAL | Status: DC
  Administered 2015-07-26 – 2015-08-01 (×7): 75 mg
  Filled 2015-07-26 (×9): qty 1

## 2015-07-26 MED ORDER — LEVOFLOXACIN IN D5W 750 MG/150ML IV SOLN
750.0000 mg | Freq: Once | INTRAVENOUS | Status: AC
  Administered 2015-07-26: 750 mg via INTRAVENOUS
  Filled 2015-07-26: qty 150

## 2015-07-26 MED ORDER — METOPROLOL TARTRATE 25 MG PO TABS
25.0000 mg | ORAL_TABLET | Freq: Two times a day (BID) | ORAL | Status: DC
  Filled 2015-07-26: qty 1

## 2015-07-26 MED ORDER — MUPIROCIN 2 % EX OINT
1.0000 "application " | TOPICAL_OINTMENT | Freq: Two times a day (BID) | CUTANEOUS | Status: AC
  Administered 2015-07-26 – 2015-07-30 (×10): 1 via NASAL
  Filled 2015-07-26: qty 22

## 2015-07-26 MED ORDER — PIPERACILLIN-TAZOBACTAM 4.5 G IVPB
4.5000 g | Freq: Three times a day (TID) | INTRAVENOUS | Status: DC
  Filled 2015-07-26 (×4): qty 100

## 2015-07-26 MED ORDER — SODIUM CHLORIDE 0.9 % IV SOLN
INTRAVENOUS | Status: DC
  Administered 2015-07-26 – 2015-07-27 (×3): via INTRAVENOUS

## 2015-07-26 MED ORDER — IPRATROPIUM-ALBUTEROL 0.5-2.5 (3) MG/3ML IN SOLN
3.0000 mL | Freq: Three times a day (TID) | RESPIRATORY_TRACT | Status: DC
  Administered 2015-07-26 – 2015-08-01 (×18): 3 mL via RESPIRATORY_TRACT
  Filled 2015-07-26 (×19): qty 3

## 2015-07-26 MED ORDER — INSULIN ASPART 100 UNIT/ML ~~LOC~~ SOLN
0.0000 [IU] | SUBCUTANEOUS | Status: DC
  Administered 2015-07-26: 3 [IU] via SUBCUTANEOUS
  Administered 2015-07-26: 8 [IU] via SUBCUTANEOUS
  Administered 2015-07-26: 11 [IU] via SUBCUTANEOUS
  Administered 2015-07-27: 3 [IU] via SUBCUTANEOUS
  Administered 2015-07-27 (×2): 2 [IU] via SUBCUTANEOUS
  Filled 2015-07-26: qty 2
  Filled 2015-07-26: qty 3
  Filled 2015-07-26: qty 8
  Filled 2015-07-26: qty 11
  Filled 2015-07-26: qty 2
  Filled 2015-07-26: qty 3

## 2015-07-26 MED ORDER — PIPERACILLIN SOD-TAZOBACTAM SO 3.375 (3-0.375) G IV SOLR
INTRAVENOUS | Status: AC
  Filled 2015-07-26: qty 3.38

## 2015-07-26 MED ORDER — PROPOFOL 1000 MG/100ML IV EMUL
5.0000 ug/kg/min | Freq: Once | INTRAVENOUS | Status: AC
  Administered 2015-07-26: 5 ug/kg/min via INTRAVENOUS

## 2015-07-26 MED ORDER — POLYETHYLENE GLYCOL 3350 17 G PO PACK
17.0000 g | PACK | Freq: Every day | ORAL | Status: DC
  Administered 2015-07-26 – 2015-07-27 (×2): 17 g via ORAL
  Filled 2015-07-26 (×2): qty 1

## 2015-07-26 MED ORDER — FENTANYL 2500MCG IN NS 250ML (10MCG/ML) PREMIX INFUSION
25.0000 ug/h | INTRAVENOUS | Status: DC
  Administered 2015-07-26: 25 ug/h via INTRAVENOUS
  Filled 2015-07-26: qty 250

## 2015-07-26 MED ORDER — VITAMIN D 1000 UNITS PO TABS
2000.0000 [IU] | ORAL_TABLET | Freq: Every day | ORAL | Status: DC
  Administered 2015-07-26 – 2015-08-01 (×7): 2000 [IU] via ORAL
  Filled 2015-07-26 (×7): qty 2

## 2015-07-26 MED ORDER — CETYLPYRIDINIUM CHLORIDE 0.05 % MT LIQD
7.0000 mL | Freq: Four times a day (QID) | OROMUCOSAL | Status: DC
  Administered 2015-07-26: 7 mL via OROMUCOSAL

## 2015-07-26 MED ORDER — LEVOTHYROXINE SODIUM 25 MCG PO TABS
150.0000 ug | ORAL_TABLET | Freq: Every day | ORAL | Status: DC
  Administered 2015-07-27 – 2015-08-01 (×6): 150 ug
  Filled 2015-07-26 (×7): qty 1

## 2015-07-26 MED ORDER — VANCOMYCIN HCL IN DEXTROSE 750-5 MG/150ML-% IV SOLN
750.0000 mg | INTRAVENOUS | Status: DC
  Administered 2015-07-26: 750 mg via INTRAVENOUS
  Filled 2015-07-26 (×4): qty 150

## 2015-07-26 MED ORDER — ETOMIDATE 2 MG/ML IV SOLN
INTRAVENOUS | Status: AC | PRN
  Administered 2015-07-26: 20 mg via INTRAVENOUS

## 2015-07-26 MED ORDER — LATANOPROST 0.005 % OP SOLN
1.0000 [drp] | Freq: Every day | OPHTHALMIC | Status: DC
  Administered 2015-07-27 – 2015-07-31 (×5): 1 [drp] via OPHTHALMIC
  Filled 2015-07-26 (×2): qty 2.5

## 2015-07-26 NOTE — Progress Notes (Signed)
Initial Nutrition Assessment   INTERVENTION:   EN: discussed nutritional poc with MD Simonds during ICU rounds; MD would like to continue pt's previous TF regimen from [facility]; orders placed for Jevity 1.5 (1 can, 5 times daily). Strict aspirations precautions.    NUTRITION DIAGNOSIS:   Inadequate oral intake related to acute illness as evidenced by NPO status.  GOAL:   Provide needs based on ASPEN/SCCM guidelines  MONITOR:    (Energy Intake, Anthropometrics, Digestive System, Electrolyte/Renal Profile, Glucose Profile)  REASON FOR ASSESSMENT:   Ventilator, Consult Enteral/tube feeding initiation and management  ASSESSMENT:    Pt admitted with AMS/unresponsiveness, pt required intubation. During intubation, large volume of TF suctioned from oropharynx   Past Medical History  Diagnosis Date  . CVA (cerebral vascular accident)   . COPD (chronic obstructive pulmonary disease)   . Hemiplegia   . Dysphagia   . Gastrostomy in place   . Muscle weakness   . Urinary tract infection   . Hyperkalemia   . Dementia      Diet Order:   NPO  Food and nutrition related history: per Maralyn Sago RD at [facility], pt has been receiving Jevity 1.5 1 can 5 times per day (1775 kcals, 76 g of protein)  Skin:   (stage II coccyx)   Digestive System: PEG tube in place, abdomen soft, BS active  Electrolyte and Renal Profile:  Recent Labs Lab 07/26/15 0050  BUN 46*  CREATININE 2.44*  NA 127*  K 4.5   Glucose Profile:   Recent Labs  07/26/15 0546 07/26/15 1302  GLUCAP 234* 199*   Protein Profile:   Recent Labs Lab 07/26/15 0050  ALBUMIN 1.9*   Meds:  NS at 50 ml/hr, ss novolog, miralax, dulcolax suppository  Height:   Ht Readings from Last 1 Encounters:  07/26/15  (1.778 m)    Weight:   Wt Readings from Last 1 Encounters:  07/26/15 129 lb 6.6 oz (58.7 kg)    Ideal Body Weight:     BMI:  Body mass index is 18.57 kg/(m^2).  Estimated  Nutritional Needs:   Kcal:  assess using [employer] equation once pt on vent at least 24 hours  Protein:  71-89 g (1.2-1.5 g/kg)   Fluid:  1475-1770 mL (25-30 ml/kg)      HIGH Care Level  Romelle Starcher MS, RD, LDN [phone] Pager

## 2015-07-26 NOTE — ED Provider Notes (Signed)
[facility] Emergency Department Provider Note  ____________________________________________  Time seen: Approximately 3:41 AM  I have reviewed the triage vital signs and the nursing notes.   HISTORY  Chief Complaint Altered Mental Status    HPI [name] is a 64 y.o. male who is coming in from a nursing facility. Yesterday the nursing facility noticed that the patient had a change in his responsiveness. They report that although he is a history of a stroke she's normally verbal and moves his right side. They report that he had some x-rays done today but those x-rays were negative. According to EMS the doctor called initially and ordered Rocephin but then called back discontinue the Rocephin and asked the nursing home to transport him to the emergency department. According to the patient's family he has had a cough and a rattle in his chest which is why they did the x-ray. The patient was also placed on oxygen at the nursing facility for hypoxia but the patient does not usually wear oxygen at home. The patient has been having some difficulty with his breathing.The patient had a temperature to 100.2 at the nursing home.   Past Medical History  Diagnosis Date  . CVA (cerebral vascular accident)   . COPD (chronic obstructive pulmonary disease)   . Hemiplegia   . Dysphagia   . Gastrostomy in place   . Muscle weakness   . Urinary tract infection   . Hyperkalemia   . Dementia     There are no active problems to display for this patient.   Past surgical history G-tube  Current Outpatient Rx  Name  Route  Sig  Dispense  Refill  . acetaminophen (TYLENOL) 650 MG suppository   Rectal   Place 650 mg rectally every 8 (eight) hours as needed.         Marland Kitchen atorvastatin (LIPITOR) 40 MG tablet   Feeding Tube   40 mg by Feeding Tube route daily.         . bisacodyl (DULCOLAX) 10 MG suppository   Rectal   Place 10 mg rectally as needed for moderate  constipation.         . cefTRIAXone (ROCEPHIN) 1 G injection   Intramuscular   Inject 1 g into the muscle once.         . Cholecalciferol (VITAMIN D3) LIQD   Feeding Tube   2,000 Units by Feeding Tube route daily.         . clopidogrel (PLAVIX) 75 MG tablet   Feeding Tube   75 mg by Feeding Tube route daily.         Marland Kitchen ipratropium-albuterol (DUONEB) 0.5-2.5 (3) MG/3ML SOLN   Nebulization   Take 3 mLs by nebulization every 8 (eight) hours. For 5 days         . latanoprost (XALATAN) 0.005 % ophthalmic solution   Left Eye   Place 1 drop into the left eye at bedtime.         . levETIRAcetam (KEPPRA) 100 MG/ML solution   Oral   Take 1,000 mg by mouth 2 (two) times daily.         Marland Kitchen levothyroxine (SYNTHROID, LEVOTHROID) 150 MCG tablet   Feeding Tube   150 mcg by Feeding Tube route daily.         . metoprolol tartrate (LOPRESSOR) 25 MG tablet   Feeding Tube   25 mg by Feeding Tube route 2 (two) times daily.         Marland Kitchen  phenylephrine-shark liver oil-mineral oil-petrolatum (PREPARATION H) 0.25-3-14-71.9 % rectal ointment   Rectal   Place 1 application rectally 2 (two) times daily as needed for hemorrhoids.         . polyethylene glycol (MIRALAX / GLYCOLAX) packet   Oral   Take 17 g by mouth daily. Mix in 4-8 oz of fluid         . valproate (DEPAKENE) 250 MG/5ML syrup   Oral   Take 750-1,000 mg by mouth 2 (two) times daily. 750mg  am, 1000mg  bedtime           Allergies Review of patient's allergies indicates no known allergies.  History reviewed. No pertinent family history.  Social History History  Substance Use Topics  . Smoking status: Former Smoker    Types: Cigarettes  . Smokeless tobacco: Not on file  . Alcohol Use: No    Review of Systems Constitutional: Fever Eyes: No visual changes. ENT: No sore throat. Cardiovascular: Denies chest pain. Respiratory: Cough and shortness of breath Gastrointestinal: No abdominal pain.  No nausea, no  vomiting.   Genitourinary: Negative for dysuria. Musculoskeletal: Negative for back pain. Skin: Negative for rash. Neurological: Decreased responsiveness and altered mental status  10-point ROS otherwise negative.  ____________________________________________   PHYSICAL EXAM:  VITAL SIGNS: ED Triage Vitals  Enc Vitals Group     BP 07/26/15 0041 113/61 mmHg     Pulse Rate 07/26/15 0041 82     Resp 07/26/15 0041 18     Temp 07/26/15 0041 98.5 F (36.9 C)     Temp Source 07/26/15 0041 Oral     SpO2 07/26/15 0041 100 %     Weight --      Height --      Head Cir --      Peak Flow --      Pain Score 07/26/15 0046 0     Pain Loc --      Pain Edu? --      Excl. in GC? --     Constitutional: Patient with decreased responsiveness and minimal activity. Does follow some intermittent commands Eyes: Conjunctivae are normal. Left PERRL. EOMI. patient with some cataract covering to right eye which family reports is from his diabetes. Head: Atraumatic. Nose: No congestion/rhinnorhea. Mouth/Throat: Mucous membranes are dry.  Oropharynx non-erythematous. Neck: Copious thick secretions heard in posterior oropharynx Cardiovascular: Normal rate, regular rhythm. Grossly normal heart sounds.  Good peripheral circulation. Respiratory: Increased respiratory effort with some mild retractions, referred upper airway secretions heard throughout lung fields. Gastrointestinal: Soft and nontender. No distention. Positive bowel sounds Genitourinary: Deferred Musculoskeletal: No lower extremity tenderness nor edema.  No joint effusions. Neurologic:  Unable to fully assess as patient altered with minimal responsiveness. Skin:  Skin is warm, dry and intact. No rash noted. Psychiatric: Patient minimally responsive with lots of secretions.  ____________________________________________   LABS (all labs ordered are listed, but only abnormal results are displayed)  Labs Reviewed  CBC - Abnormal; Notable  for the following:    WBC 13.9 (*)    RBC 3.72 (*)    Hemoglobin 10.2 (*)    HCT 31.3 (*)    RDW 15.6 (*)    All other components within normal limits  COMPREHENSIVE METABOLIC PANEL - Abnormal; Notable for the following:    Sodium 127 (*)    Chloride 91 (*)    Glucose, Bld 310 (*)    BUN 46 (*)    Creatinine, Ser 2.44 (*)    Calcium  7.6 (*)    Total Protein 6.2 (*)    Albumin 1.9 (*)    ALT 12 (*)    Total Bilirubin <0.1 (*)    GFR calc non Af Amer 27 (*)    GFR calc Af Amer 31 (*)    All other components within normal limits  TROPONIN I - Abnormal; Notable for the following:    Troponin I 0.05 (*)    All other components within normal limits  BLOOD GAS, ARTERIAL - Abnormal; Notable for the following:    pH, Arterial 7.53 (*)    pO2, Arterial 70 (*)    Acid-Base Excess 4.4 (*)    Allens test (pass/fail) POSITIVE (*)    All other components within normal limits  CULTURE, BLOOD (ROUTINE X 2)  CULTURE, BLOOD (ROUTINE X 2)  LACTIC ACID, PLASMA  URINALYSIS COMPLETEWITH MICROSCOPIC ([facility] ONLY)   ____________________________________________  EKG  ED ECG REPORT I, Rebecka Apley, the attending physician, personally viewed and interpreted this ECG.   Date: 07/26/2015  EKG Time: 0032  Rate: 82  Rhythm: normal sinus rhythm  Axis: normal  Intervals:none  ST&T Change: q waves in leads III and avf, flipped t waves I and avL   ED ECG REPORT #2 I, Rebecka Apley, the attending physician, personally viewed and interpreted this ECG.   Date: 07/26/2015  EKG Time: 303  Rate: 75  Rhythm: normal sinus rhythm  Axis: normal  Intervals:none  ST&T Change: q waves in leads III and avf, flipped t waves I and avL  ____________________________________________  RADIOLOGY  CT head: Severe generalized atrophy and chronic microvascular ischemic disease, remote infarctions of the right anterior frontal lobe and right posteromedial occipital lobe, no acute intracranial  findings  CXR: Mild Peribronchial thickening noted, lungs otherwise clear ____________________________________________   PROCEDURES  Procedure(s) performed: Please, see procedure note(s).  INTUBATION Performed by: Lucrezia Europe P  Required items: required blood products, implants, devices, and special equipment available Patient identity confirmed: provided demographic data and hospital-assigned identification number Time out: Immediately prior to procedure a "time out" was called to verify the correct patient, procedure, equipment, support staff and site/side marked as required.  Indications: not protecting airway, altered mental status  Intubation method: Glidescope Laryngoscopy   Preoxygenation: BVM  Sedatives: Etomidate Paralytic: Succinylcholine  Tube Size: 7.5 cuffed  Post-procedure assessment: chest rise and ETCO2 monitor Breath sounds: equal and absent over the epigastrium Tube secured with: ETT holder Chest x-ray interpreted by radiologist and me.  Chest x-ray findings: endotracheal tube in appropriate position  Patient tolerated the procedure well with no immediate complications.   Critical Care performed: Yes, see critical care note(s)  CRITICAL CARE Performed by: Lucrezia Europe P   Total critical care time: 45 minutes  Critical care time was exclusive of separately billable procedures and treating other patients.  Critical care was necessary to treat or prevent imminent or life-threatening deterioration.  Critical care was time spent personally by me on the following activities: development of treatment plan with patient and/or surrogate as well as nursing, discussions with consultants, evaluation of patient's response to treatment, examination of patient, obtaining history from patient or surrogate, ordering and performing treatments and interventions, ordering and review of laboratory studies, ordering and review of radiographic studies, pulse  oximetry and re-evaluation of patient's condition.  ____________________________________________   INITIAL IMPRESSION / ASSESSMENT AND PLAN / ED COURSE  Pertinent labs & imaging results that were available during my care of the  patient were reviewed by me and considered in my medical decision making (see chart for details).  This is a 64 year old male who was sent in from his nursing home with altered mental status. The patient has copious amounts of secretions with a concern that he may have aspirated his tube feeds. As were evaluating the patient he became more sonorous with the secretions. He started having some episodes of tachycardia so the decision was made to intubate the patient. We did suction some white secretions from the patient's posterior oropharynx prior to intubation. The patient was intubated without any difficulty. He was started on propofol as a trip but his blood pressure did decrease with the propofol. The patient was given a liter of normal saline, Zosyn and level floxacillin. He will be admitted to the hospitalist service in the intensive care unit. ____________________________________________   FINAL CLINICAL IMPRESSION(S) / ED DIAGNOSES  Final diagnoses:  Aspiration pneumonia, unspecified aspiration pneumonia type  Altered mental status, unspecified altered mental status type      Rebecka Apley, MD 07/26/15 0401

## 2015-07-26 NOTE — ED Notes (Signed)
Lab notified of add on lactate

## 2015-07-26 NOTE — Progress Notes (Signed)
Orders to extubate. Extubated patient to a 3 lpm nasal cannula without complication. Current SpO2 is 99%.

## 2015-07-26 NOTE — Progress Notes (Signed)
ANTIBIOTIC CONSULT NOTE - INITIAL  Pharmacy Consult for Vancomycin/Zosyn Indication: aspiration PNA   No Known Allergies  Patient Measurements: Height:  (177.8 cm) Weight: 129 lb 6.6 oz (58.7 kg) IBW/kg (Calculated) : 73   Vital Signs: Temp: 98.4 F (36.9 C) (08/03 0600) Temp Source: Oral (08/03 0600) BP: 105/62 mmHg (08/03 0626) Pulse Rate: 73 (08/03 0626) Intake/Output from previous day: 08/02 0701 - 08/03 0700 In: 150 [IV Piggyback:150] Out: -  Intake/Output from this shift: Total I/O In: 150 [IV Piggyback:150] Out: -   Labs:  Recent Labs  07/26/15 0050  WBC 13.9*  HGB 10.2*  PLT 223  CREATININE 2.44*   Estimated Creatinine Clearance: 26.1 mL/min (by C-G formula based on Cr of 2.44). No results for input(s): VANCOTROUGH, VANCOPEAK, VANCORANDOM, GENTTROUGH, GENTPEAK, GENTRANDOM, TOBRATROUGH, TOBRAPEAK, TOBRARND, AMIKACINPEAK, AMIKACINTROU, AMIKACIN in the last 72 hours.   Kinetics:   Ke: 0.026 Vd: 41.1  Microbiology: No results found for this or any previous visit (from the past 720 hour(s)).  Medical History: Past Medical History  Diagnosis Date  . CVA (cerebral vascular accident)   . COPD (chronic obstructive pulmonary disease)   . Hemiplegia   . Dysphagia   . Gastrostomy in place   . Muscle weakness   . Urinary tract infection   . Hyperkalemia   . Dementia     Medications:  Scheduled:  . antiseptic oral rinse  7 mL Mouth Rinse QID  . atorvastatin  40 mg Per Tube Daily  . chlorhexidine  15 mL Mouth Rinse BID  . cholecalciferol  2,000 Units Oral Daily  . clopidogrel  75 mg Per Tube Daily  . heparin  5,000 Units Subcutaneous 3 times per day  . ipratropium-albuterol  3 mL Nebulization 3 times per day  . latanoprost  1 drop Left Eye QHS  . levETIRAcetam  1,000 mg Per Tube BID  . levothyroxine  150 mcg Per Tube QAC breakfast  . metoprolol tartrate  25 mg Per Tube BID  . piperacillin-tazobactam      . piperacillin-tazobactam  4.5 g  Intravenous 3 times per day  . polyethylene glycol  17 g Oral Daily  . scopolamine  1 patch Transdermal Q72H  . Valproic Acid  750 mg Per Tube q morning - 10a   And  . Valproic Acid  1,000 mg Per Tube QHS  . vancomycin  750 mg Intravenous STAT  . vancomycin  750 mg Intravenous Q24H   Infusions:  . sodium chloride 100 mL/hr at 07/26/15 0618  . fentaNYL infusion INTRAVENOUS 25 mcg/hr (07/26/15 0618)   PRN: acetaminophen, bisacodyl, fentaNYL, ondansetron **OR** ondansetron (ZOFRAN) IV, phenylephrine-shark liver oil-mineral oil-petrolatum  Assessment: 64 y/o M resident of LTCF with possible aspiration PNA and chronic kidney failure.   Goal of Therapy:  Vancomycin trough level 15-20 mcg/ml  Plan:  1. Zosyn 3.375 g EI once then 4.5 g EI q 8 h.   2. Vancomycin 750 mg iv q 24 hours with stacked dosing and a trough with the 4th dose.   [name] D 07/26/2015,6:37 AM

## 2015-07-26 NOTE — Consult Note (Signed)
PULMONARY / CRITICAL CARE MEDICINE   Name: [name] MRN: [id number] DOB: 1951/02/02    ADMISSION DATE:  07/26/2015 CONSULTATION DATE:  8/03  REFERRING MD :  Eliane Decree  PT PROFILE: 42 M NH resident with severe baseline debilitation due to prior strokes (hemiplegia, chonic G-tube). Admitted early 8/03 AM from NH via EMS due to AMS/unresponsiveness. During intubation, tube feedings noted in oropharynx. Treated as aspiration PNA.   SIGNIFICANT EVENTS/STUDIES:  8/03 CT head: Severe generalized atrophy and chronic microvascular ischemic disease. Remote infarctions of the right anterior frontal lobe and right posteromedial occipital lobe. No acute intracranial findings  INDWELLING DEVICES:: ETT 8/03 >> 8/03   MICRO DATA: MRSA PCR 8/03 >> POS Urine 8/03 >>   Blood 8/03 >>    ANTIMICROBIALS:  Vanc 8/03 >>  Pip-tazo 8/03 >>     HISTORY OF PRESENT ILLNESS:   The pt is unable to provide history and there is no family available. The following history from the admission H&P has been reviewed: Patient with past medical history significant for stroke, left side hemiplegia and dysphagia presents to the emergency department via EMS from his nursing home for progressively worsening mental status. Reportedly had an x-ray ordered today due to concern for pneumonia. The x-ray was canceled and the patient was ordered an. Treatment with Rocephin. However he did not receive Rocephin and was sent to the hospital for evaluation instead. In the emergency department he was found to barely be responsive and making gurgling noises to gave rise for concern or guarding airway protection. He was intubated. Large volume of tube feeds found in posterior oropharynx prior to intubation. Blood cultures obtained and started on Zosyn and vancomycin which prompted the emergency department staff to call for admission.  PAST MEDICAL HISTORY :   has a past medical history of CVA (cerebral vascular accident); COPD (chronic  obstructive pulmonary disease); Hemiplegia; Dysphagia; Gastrostomy in place; Muscle weakness; Urinary tract infection; Hyperkalemia; and Dementia.  has past surgical history that includes PEG tube placement. Prior to Admission medications   Medication Sig Start Date End Date Taking? Authorizing Provider  acetaminophen (TYLENOL) 650 MG suppository Place 650 mg rectally every 8 (eight) hours as needed.   Yes Historical Provider, MD  atorvastatin (LIPITOR) 40 MG tablet 40 mg by Feeding Tube route daily.   Yes Historical Provider, MD  bisacodyl (DULCOLAX) 10 MG suppository Place 10 mg rectally as needed for moderate constipation.   Yes Historical Provider, MD  cefTRIAXone (ROCEPHIN) 1 G injection Inject 1 g into the muscle once.   Yes Historical Provider, MD  Cholecalciferol (VITAMIN D3) LIQD 2,000 Units by Feeding Tube route daily.   Yes Historical Provider, MD  clopidogrel (PLAVIX) 75 MG tablet 75 mg by Feeding Tube route daily.   Yes Historical Provider, MD  ipratropium-albuterol (DUONEB) 0.5-2.5 (3) MG/3ML SOLN Take 3 mLs by nebulization every 8 (eight) hours. For 5 days 07/24/15 07/28/15 Yes Historical Provider, MD  latanoprost (XALATAN) 0.005 % ophthalmic solution Place 1 drop into the left eye at bedtime.   Yes Historical Provider, MD  levETIRAcetam (KEPPRA) 100 MG/ML solution Take 1,000 mg by mouth 2 (two) times daily.   Yes Historical Provider, MD  levothyroxine (SYNTHROID, LEVOTHROID) 150 MCG tablet 150 mcg by Feeding Tube route daily.   Yes Historical Provider, MD  metoprolol tartrate (LOPRESSOR) 25 MG tablet 25 mg by Feeding Tube route 2 (two) times daily.   Yes Historical Provider, MD  phenylephrine-shark liver oil-mineral oil-petrolatum (PREPARATION H) 0.25-3-14-71.9 %  rectal ointment Place 1 application rectally 2 (two) times daily as needed for hemorrhoids.   Yes Historical Provider, MD  polyethylene glycol (MIRALAX / GLYCOLAX) packet Take 17 g by mouth daily. Mix in 4-8 oz of fluid   Yes  Historical Provider, MD  valproate (DEPAKENE) 250 MG/5ML syrup Take 750-1,000 mg by mouth 2 (two) times daily. 750mg  am, 1000mg  bedtime   Yes Historical Provider, MD   No Known Allergies  FAMILY HISTORY:  has no family status information on file.  SOCIAL HISTORY:  reports that he has quit smoking. His smoking use included Cigarettes. He does not have any smokeless tobacco history on file. He reports that he does not drink alcohol or use illicit drugs.  REVIEW OF SYSTEMS:  N/A  SUBJECTIVE:  RASS -1 to -2. Not F/C. Strong cough upon suctioning   VITAL SIGNS: Temp:  [97.6 F (36.4 C)-98.5 F (36.9 C)] 97.6 F (36.4 C) (08/03 0700) Pulse Rate:  [65-95] 89 (08/03 1042) Resp:  [12-25] 22 (08/03 0715) BP: (71-145)/(49-75) 85/58 mmHg (08/03 1042) SpO2:  [99 %-100 %] 100 % (08/03 0715) FiO2 (%):  [40 %] 40 % (08/03 0830) Weight:  [58.7 kg (129 lb 6.6 oz)] 58.7 kg (129 lb 6.6 oz) (08/03 0600) HEMODYNAMICS:   VENTILATOR SETTINGS: Vent Mode:  [-] Spontaneous FiO2 (%):  [40 %] 40 % Set Rate:  [16 bmp] 16 bmp Vt Set:  [400 mL-450 mL] 400 mL PEEP:  [5 cmH20] 5 cmH20 Pressure Support:  [5 cmH20] 5 cmH20 INTAKE / OUTPUT:  Intake/Output Summary (Last 24 hours) at 07/26/15 1126 Last data filed at 07/26/15 0618  Gross per 24 hour  Intake    156 ml  Output      0 ml  Net    156 ml    PHYSICAL EXAMINATION: General: Chronically ill appearing.  Neuro: EOMI, R pupil reacts to light, L pupil cannot be assessed. L hemiplegia. Increased tone on L HEENT:  Temporal wasting, R cornea heavily opacified, poor dentition Cardiovascular: Reg, +gallop, no M Lungs: Clear anteriorly, no wheezes Abdomen: G tube in place, abd soft, +BS Ext: warm, no edema  LABS:  CBC  Recent Labs Lab 07/26/15 0050  WBC 13.9*  HGB 10.2*  HCT 31.3*  PLT 223   Coag's No results for input(s): APTT, INR in the last 168 hours. BMET  Recent Labs Lab 07/26/15 0050  NA 127*  K 4.5  CL 91*  CO2 26  BUN 46*   CREATININE 2.44*  GLUCOSE 310*   Electrolytes  Recent Labs Lab 07/26/15 0050  CALCIUM 7.6*   Sepsis Markers  Recent Labs Lab 07/26/15 0050  LATICACIDVEN 1.6   ABG  Recent Labs Lab 07/26/15 0335  PHART 7.53*  PCO2ART 32  PO2ART 70*   Liver Enzymes  Recent Labs Lab 07/26/15 0050  AST 22  ALT 12*  ALKPHOS 85  BILITOT <0.1*  ALBUMIN 1.9*   Cardiac Enzymes  Recent Labs Lab 07/26/15 0050 07/26/15 0655  TROPONINI 0.05* 0.04*   Glucose  Recent Labs Lab 07/26/15 0546  GLUCAP 234*    CXR: Vasc congestion, no overt edema or infiltrates    ASSESSMENT / PLAN:  PULMONARY A: Vent dependent respiratory failure - appears to have been intubated primarily for AMS P:   Extubate to [place] O2 Monitor in ICU post extubation Cont nebulized bronchodilators  CARDIOVASCULAR A:  No acute issues P:  monitor  RENAL A:   AKI, nonoliguric CRI ( baseline Cr 1.7 - 1.9) Very  poor candidate for HD of any duration P:   Monitor BMET intermittently Monitor I/Os Correct electrolytes as indicated  GASTROINTESTINAL A:   Chronic dysphagia Chronic G-tube status P:   SUP: N/I post extubation Initiate TFs 8/03 with HOB elevation  HEMATOLOGIC A:   No issues P:  DVT px: SQ heparin Monitor CBC intermittently Transfuse per usual guidelines  INFECTIOUS A:   Concern for aspiration PNA  No infiltrate on current CXR MRSA colonization P:   Monitor temp, WBC count Micro and abx as above Check PCT AM 8/04 - consider DC abx if normal Follow up micro data and adjust abx accordingly Contact precautions  ENDOCRINE A:  Hyperglycemia without prior dx of DM Hypothyroidism P:   Check A1C in AM 8/04 SSI initiated 8/03 Cont levothyroxine per tube Check TSH in AM 8/04  NEUROLOGIC A:   Obtundation of unclear etiology Prior CVAs Seizure disorder Severe chronic debilitation P:   RASS goal: 0 Cont anticonvulsants per home regimen Minimize  sedatives/opioids Suggest Palliative Care consultation   Discussed with Dr Allena Katz  45 mins CCM time     Billy Fischer, MD  Mobile [phone].  Pulmonary and Critical Care Medicine   07/26/2015, 11:26 AM

## 2015-07-26 NOTE — ED Notes (Signed)
Pt presents to ED via EMS for altered mental status. Pt non-verbal and not able to do usual activities with temp-102 per [employer]. Pt alerts and verbal at baseline per staff. Wheezing noted.

## 2015-07-26 NOTE — Clinical Documentation Improvement (Signed)
Supporting Information: Patient sent from nursing home with altered mental status per 8/03 progress notes.    Possible Clinical Condition: . Document the etiology of the altered mental status as: --Coma --Confusion/delirium (including drug-induced) --Drowsiness/somnolence --Stupor/semi-coma --Transient alteration of awareness --Encephalopathy Alcoholic Anoxic/hypoxic Drug-induced/toxic (specify drug) Hepatic Hypertensive Hypoglycemic Metabolic/septic Traumatic/post-concussion [place]ern[place] Other (specify) . Document any associated diagnoses/conditions    Thank Gabriel Cirri Documentation Specialist [phone] [email or web address] [email or web address]

## 2015-07-26 NOTE — Progress Notes (Signed)
[facility] - [place] at [facility]                                                                                                                                                                                            Patient Demographics   [name], is a 64 y.o. male, DOB - 1951-08-12, WUJ:[id number]  Admit date - 07/26/2015   Admitting Physician Arnaldo Natal, MD  Outpatient Primary MD for the patient is FISCHER, TIMOTHY LEE, DO   LOS - 0  Subjective: Patient admitted with decrease in responsiveness acute respiratory failure, here he was found to have tube feeds which were sucked out. Patient remains on the ventilation he received fentanyl overnight which is currently being held to assist his alertness   Review of Systems:   Patient currently on the ventilator and sleepy and unable to give any review of systems  Vitals:   Filed Vitals:   07/26/15 0626 07/26/15 0700 07/26/15 0715 07/26/15 1042  BP: 105/62 118/65 127/75 85/58  Pulse: 73 65 68 89  Temp:  97.6 F (36.4 C)    TempSrc:  Oral    Resp: 16 16 22    Height:      Weight:      SpO2: 100% 100% 100%     Wt Readings from Last 3 Encounters:  07/26/15 58.7 kg (129 lb 6.6 oz)     Intake/Output Summary (Last 24 hours) at 07/26/15 1338 Last data filed at 07/26/15 0618  Gross per 24 hour  Intake    156 ml  Output      0 ml  Net    156 ml    Physical Exam:   GENERAL:Critically ill appearing  HEAD, EYES, EARS, NOSE AND THROAT: Atraumatic, normocephalic. Extraocular muscles are intact. Pupils equal and reactive to light. Sclerae anicteric. No conjunctival injection. No oro-pharyngeal erythema.  NECK: Supple. There is no jugular venous distention. No bruits, no lymphadenopathy, no thyromegaly.  HEART: Regular rate and rhythm,. No murmurs, no rubs, no clicks.  LUNGS:On the ventilator , bilateral rhonchi  ABDOMEN: Soft, flat, nontender, nondistended. Has good bowel sounds. No  hepatosplenomegaly appreciated.  EXTREMITIES: No evidence of any cyanosis, clubbing, or peripheral edema.  +2 pedal and radial pulses bilaterally.  NEUROLOGIC: Sedated  SKIN: Moist and warm with no rashes appreciated.  Psych:Sedated  LN: No inguinal LN enlargement    Antibiotics   Anti-infectives    Start     Dose/Rate Route Frequency Ordered Stop   07/26/15 1330  vancomycin (VANCOCIN) IVPB 750 mg/150 ml premix     750 mg 150 mL/hr over 60 Minutes  Intravenous Every 24 hours 07/26/15 0636     07/26/15 1130  piperacillin-tazobactam (ZOSYN) IVPB 4.5 g  Status:  Discontinued     4.5 g 25 mL/hr over 30 Minutes Intravenous 3 times per day 07/26/15 0636 07/26/15 0911   07/26/15 1130  piperacillin-tazobactam (ZOSYN) IVPB 4.5 g     4.5 g 25 mL/hr over 4 Hours Intravenous 3 times per day 07/26/15 0911     07/26/15 0645  vancomycin (VANCOCIN) IVPB 750 mg/150 ml premix     750 mg 150 mL/hr over 60 Minutes Intravenous STAT 07/26/15 0636 07/26/15 0852   07/26/15 0330  levofloxacin (LEVAQUIN) IVPB 750 mg     750 mg 100 mL/hr over 90 Minutes Intravenous  Once 07/26/15 0322 07/26/15 0529   07/26/15 0330  piperacillin-tazobactam (ZOSYN) IVPB 3.375 g     3.375 g 12.5 mL/hr over 240 Minutes Intravenous  Once 07/26/15 0322 07/26/15 0359   07/26/15 0328  piperacillin-tazobactam (ZOSYN) 3.375 (3-0.375) G injection    Comments:  [place], [place]: cabinet override      07/26/15 0328 07/26/15 1529      Medications   Scheduled Meds: . antiseptic oral rinse  7 mL Mouth Rinse QID  . atorvastatin  40 mg Per Tube Daily  . chlorhexidine  15 mL Mouth Rinse BID  . Chlorhexidine Gluconate Cloth  6 each Topical Q0600  . cholecalciferol  2,000 Units Oral Daily  . clopidogrel  75 mg Per Tube Daily  . feeding supplement (JEVITY 1.5 CAL/FIBER)  237 mL Per Tube 5 X Daily  . heparin  5,000 Units Subcutaneous 3 times per day  . insulin aspart  0-15 Units Subcutaneous 6 times per day  . ipratropium-albuterol  3 mL  Nebulization 3 times per day  . latanoprost  1 drop Left Eye QHS  . levETIRAcetam  1,000 mg Per Tube BID  . levothyroxine  150 mcg Per Tube QAC breakfast  . metoprolol tartrate  25 mg Per Tube BID  . mupirocin ointment  1 application Nasal BID  . piperacillin-tazobactam      . piperacillin-tazobactam  4.5 g Intravenous 3 times per day  . polyethylene glycol  17 g Oral Daily  . Valproic Acid  750 mg Per Tube q morning - 10a   And  . Valproic Acid  1,000 mg Per Tube QHS  . vancomycin  750 mg Intravenous Q24H   Continuous Infusions: . sodium chloride 50 mL/hr at 07/26/15 1234   PRN Meds:.acetaminophen, bisacodyl, ondansetron **OR** ondansetron (ZOFRAN) IV, phenylephrine-shark liver oil-mineral oil-petrolatum   Data Review:   Micro Results Recent Results (from the past 240 hour(s))  Culture, blood (routine x 2)     Status: None (Preliminary result)   Collection Time: 07/26/15 12:50 AM  Result Value Ref Range Status   Specimen Description BLOOD RIGHT ASSIST CONTROL  Final   Special Requests BOTTLES DRAWN AEROBIC AND ANAEROBIC 5CC  Final   Culture NO GROWTH < 12 HOURS  Final   Report Status PENDING  Incomplete  Culture, blood (routine x 2)     Status: None (Preliminary result)   Collection Time: 07/26/15  2:22 AM  Result Value Ref Range Status   Specimen Description BLOOD BLOOD RIGHT FOREARM  Final   Special Requests   Final    BOTTLES DRAWN AEROBIC AND ANAEROBIC 1CCANAEROBIC, 7CCAEROBIC   Culture NO GROWTH < 12 HOURS  Final   Report Status PENDING  Incomplete  MRSA PCR Screening     Status: Abnormal  Collection Time: 07/26/15  5:54 AM  Result Value Ref Range Status   MRSA by PCR POSITIVE (A) NEGATIVE Final    Comment:        The GeneXpert MRSA Assay (FDA approved for NASAL specimens only), is one component of a comprehensive MRSA colonization surveillance program. It is not intended to diagnose MRSA infection nor to guide or monitor treatment for MRSA  infections. CRITICAL RESULT CALLED TO, READ BACK BY AND VERIFIED WITH: JOHANNA LINDSEY 07/26/15 0840 BOD     Radiology Reports Ct Head Wo Contrast  07/26/2015   CLINICAL DATA:  Altered mental status.  Febrile.  EXAM: CT HEAD WITHOUT CONTRAST  TECHNIQUE: Contiguous axial images were obtained from the base of the skull through the vertex without intravenous contrast.  COMPARISON:  04/12/2015  FINDINGS: There is no intracranial hemorrhage, mass or evidence of acute infarction. There is no extra-axial fluid collection. There is severe generalized atrophy. There is severe white matter hypodensity consistent with chronic microvascular ischemic disease. There is encephalomalacia due to remote right anterior frontal lobe infarction and remote right posteromedial occipital lobe infarction. No interval change is evident.  The right ocular lens is displaced into the center of the globe. This is unchanged. Visible paranasal sinuses are clear.  IMPRESSION: Severe generalized atrophy and chronic microvascular ischemic disease. Remote infarctions of the right anterior frontal lobe and right posteromedial occipital lobe. No acute intracranial findings.   Electronically Signed   By: Ellery Plunk M.D.   On: 07/26/2015 01:32   Dg Chest Portable 1 View  07/26/2015   CLINICAL DATA:  Endotracheal intubation.  EXAM: PORTABLE CHEST - 1 VIEW  COMPARISON:  07/26/2015  FINDINGS: The endotracheal tube is 3.1 cm above the carina. There is no pneumothorax. There is no large effusion. Heart size is normal. There is no confluent airspace consolidation.  IMPRESSION: Satisfactory ET tube placement.   Electronically Signed   By: Ellery Plunk M.D.   On: 07/26/2015 04:35   Dg Chest Portable 1 View  07/26/2015   CLINICAL DATA:  Acute onset of cough and chest rattling. Altered mental status. Initial encounter.  EXAM: PORTABLE CHEST - 1 VIEW  COMPARISON:  Chest radiograph performed 04/12/2015  FINDINGS: The lungs are well-aerated. Mild  peribronchial thickening is noted. There is no evidence of focal opacification, pleural effusion or pneumothorax.  The cardiomediastinal silhouette is borderline normal in size. No acute osseous abnormalities are seen. Mild degenerative change is noted at the glenohumeral joints bilaterally.  IMPRESSION: Mild peribronchial thickening noted; lungs otherwise clear.   Electronically Signed   By: Roanna Raider M.D.   On: 07/26/2015 01:24     CBC  Recent Labs Lab 07/26/15 0050  WBC 13.9*  HGB 10.2*  HCT 31.3*  PLT 223  MCV 84.1  MCH 27.5  MCHC 32.7  RDW 15.6*    Chemistries   Recent Labs Lab 07/26/15 0050  NA 127*  K 4.5  CL 91*  CO2 26  GLUCOSE 310*  BUN 46*  CREATININE 2.44*  CALCIUM 7.6*  AST 22  ALT 12*  ALKPHOS 85  BILITOT <0.1*   ------------------------------------------------------------------------------------------------------------------ estimated creatinine clearance is 26.1 mL/min (by C-G formula based on Cr of 2.44). ------------------------------------------------------------------------------------------------------------------ No results for input(s): HGBA1C in the last 72 hours. ------------------------------------------------------------------------------------------------------------------ No results for input(s): CHOL, HDL, LDLCALC, TRIG, CHOLHDL, LDLDIRECT in the last 72 hours. ------------------------------------------------------------------------------------------------------------------  Recent Labs  07/26/15 0655  TSH 0.617   ------------------------------------------------------------------------------------------------------------------ No results for input(s): VITAMINB12, FOLATE, FERRITIN, TIBC, IRON, RETICCTPCT  in the last 72 hours.  Coagulation profile No results for input(s): INR, PROTIME in the last 168 hours.  No results for input(s): DDIMER in the last 72 hours.  Cardiac Enzymes  Recent Labs Lab 07/26/15 0050 07/26/15 0655  07/26/15 1045  TROPONINI 0.05* 0.04* 0.03   ------------------------------------------------------------------------------------------------------------------ Invalid input(s): POCBNP    Assessment & Plan   This 64 year old male admitted for likely aspiration pneumonia as well as acute on chronic renal failure. 1. Pneumonia: Likely aspiration.  Continue broad-spectrum anabiotic's with IV vancomycin and Zosyn. Hold tube feeds 2. Sepsis: Due to pneumonia continue aggressive IV anabiotic 3. Acute on chronic kidney disease: Due to ATN given IV fluids monitor renal function if no improvement in nephrology consult 4. Hyponatremia: Likely due to dehydration IV fluids monitor sodium 5. History remote stroke; Continue Plavix  6. Hypertension: Continue metoprolol 7. Hypothyroidism: Continue Synthroid 8. Seizure disorder: Likely secondary to CVA; continue Keppra and Depakote 9. DVT prophylaxis: Heparin 10. GI prophylaxis: None until 48 hours on mechanical ventilation     Code Status Orders        Start     Ordered   07/26/15 0506  Full code   Continuous     07/26/15 0505           Consults  pulmonary  DVT Prophylaxis   heparin  Lab Results  Component Value Date   PLT 223 07/26/2015     Time Spent in minutes  55 critical care time  Auburn Bilberry M.D on 07/26/2015 at 1:38 PM  Between 7am to 6pm - Pager - [phone]  After 6pm go to [email or web address] - password [email or web address] [facility]  [facility]   Office  [phone]

## 2015-07-26 NOTE — Clinical Social Work Note (Signed)
Clinical Social Work Assessment  Patient Details  Name: [name] MRN: [id number] Date of Birth: 1951-09-28  Date of referral:  07/26/15               Reason for consult:  Other (Comment Required) (from SNF / [facility])                Permission sought to share information with:   (unable to reach family) Permission granted to share information::     Name::        Agency::     Relationship::     Contact Information:     Housing/Transportation Living arrangements for the past 2 months:  Skilled Nursing Facility Source of Information:  Facility Patient Interpreter Needed:  None Criminal Activity/Legal Involvement Pertinent to Current Situation/Hospitalization:    Significant Relationships:  Other Family Members, Adult Children Lives with:  Facility Resident Do you feel safe going back to the place where you live?    Need for family participation in patient care:     Care giving concerns:  None at this time   Office manager / plan:  Patient from [facility], he is not oriented at this time, however per [name], Charity fundraiser at [facility] patient is usually oriented to self and family.  He is responsive to some verbal clues but unable to talk in full sentences.  Patient currently lives long-term at [facility] and has been there about two months.  Per facility, family is involved in his care. Support systems include patient's daughter and sister. Call to daughter and sister, unable to reach either.  Call to [facility], per [name] patient is able to return to facility when he is medically stable.  CSW will continue follow patient for ongoing/ disposition needs and complete FL2 in anticipation of patient returning to facility.    Employment status:    Insurance information:  Medicaid [place], [employer] PT Recommendations:  Not assessed at this time Information / Referral to community resources:   (ongoing skilled Nursing facility )  Patient/Family's Response  to care:  Unable to reach family and patient is not oriented at this time.  Patient/Family's Understanding of and Emotional Response to Diagnosis, Current Treatment, and Prognosis:  Unable to reach family and patient is not oriented at this time.   Emotional Assessment Appearance:  Appears older than stated age Attitude/Demeanor/Rapport:  Unable to Assess Affect (typically observed):  Unable to Assess Orientation:   (not oriented at this time, baseline is oriented to self and family) Alcohol / Substance use:  Not Applicable Psych involvement (Current and /or in the community):     Discharge Needs  Concerns to be addressed:  No discharge needs identified (patient will return to [facility]) Readmission within the last 30 days:  No Current discharge risk:  Chronically ill, Dependent with Mobility Barriers to Discharge:  No Barriers Identified   [name], LCSW 07/26/2015, 1:30 PM [name]. [name], MSW Clinical Social Work Department Emergency Room [phone] 1:35 PM

## 2015-07-26 NOTE — H&P (Addendum)
[name] is an 64 y.o. male.    Chief Complaint: Altered mental status HPI: Patient with past medical history significant for stroke, left side hemiplegia and dysphagia presents to the emergency department via EMS from his nursing home for progressively worsening mental status. Reportedly had an x-ray ordered today due to concern for pneumonia. The x-ray was canceled and the patient was ordered an. Treatment with Rocephin. However he did not receive Rocephin and was sent to the hospital for evaluation instead. In the emergency department he was found to barely be responsive and making gurgling noises to gave rise for concern or guarding airway protection. He was intubated. Large volume of tube feeds found in posterior oropharynx prior to intubation. Blood cultures obtained and started on Zosyn and vancomycin which prompted the emergency department staff to call for admission.  Past Medical History  Diagnosis Date  . CVA (cerebral vascular accident)   . COPD (chronic obstructive pulmonary disease)   . Hemiplegia   . Dysphagia   . Gastrostomy in place   . Muscle weakness   . Urinary tract infection   . Hyperkalemia   . Dementia     Past Surgical History  Procedure Laterality Date  . Peg tube placement      Family History  Problem Relation Age of Onset  .      unavailable patient is intubated and sedated  Social History:  reports that he has quit smoking. His smoking use included Cigarettes. He does not have any smokeless tobacco history on file. He reports that he does not drink alcohol or use illicit drugs.  Allergies: No Known Allergies  Prior to Admission medications   Medication Sig Start Date End Date Taking? Authorizing Provider  acetaminophen (TYLENOL) 650 MG suppository Place 650 mg rectally every 8 (eight) hours as needed.   Yes Historical Provider, MD  atorvastatin (LIPITOR) 40 MG tablet 40 mg by Feeding Tube route daily.   Yes Historical Provider, MD  bisacodyl  (DULCOLAX) 10 MG suppository Place 10 mg rectally as needed for moderate constipation.   Yes Historical Provider, MD  cefTRIAXone (ROCEPHIN) 1 G injection Inject 1 g into the muscle once.   Yes Historical Provider, MD  Cholecalciferol (VITAMIN D3) LIQD 2,000 Units by Feeding Tube route daily.   Yes Historical Provider, MD  clopidogrel (PLAVIX) 75 MG tablet 75 mg by Feeding Tube route daily.   Yes Historical Provider, MD  ipratropium-albuterol (DUONEB) 0.5-2.5 (3) MG/3ML SOLN Take 3 mLs by nebulization every 8 (eight) hours. For 5 days 07/24/15 07/28/15 Yes Historical Provider, MD  latanoprost (XALATAN) 0.005 % ophthalmic solution Place 1 drop into the left eye at bedtime.   Yes Historical Provider, MD  levETIRAcetam (KEPPRA) 100 MG/ML solution Take 1,000 mg by mouth 2 (two) times daily.   Yes Historical Provider, MD  levothyroxine (SYNTHROID, LEVOTHROID) 150 MCG tablet 150 mcg by Feeding Tube route daily.   Yes Historical Provider, MD  metoprolol tartrate (LOPRESSOR) 25 MG tablet 25 mg by Feeding Tube route 2 (two) times daily.   Yes Historical Provider, MD  phenylephrine-shark liver oil-mineral oil-petrolatum (PREPARATION H) 0.25-3-14-71.9 % rectal ointment Place 1 application rectally 2 (two) times daily as needed for hemorrhoids.   Yes Historical Provider, MD  polyethylene glycol (MIRALAX / GLYCOLAX) packet Take 17 g by mouth daily. Mix in 4-8 oz of fluid   Yes Historical Provider, MD  valproate (DEPAKENE) 250 MG/5ML syrup Take 750-1,000 mg by mouth 2 (two) times daily. 763m am, 10087mbedtime  Yes Historical Provider, MD     Results for orders placed or performed during the hospital encounter of 07/26/15 (from the past 48 hour(s))  CBC     Status: Abnormal   Collection Time: 07/26/15 12:50 AM  Result Value Ref Range   WBC 13.9 (H) 3.8 - 10.6 K/uL   RBC 3.72 (L) 4.40 - 5.90 MIL/uL   Hemoglobin 10.2 (L) 13.0 - 18.0 g/dL   HCT 31.3 (L) 40.0 - 52.0 %   MCV 84.1 80.0 - 100.0 fL   MCH 27.5 26.0 -  34.0 pg   MCHC 32.7 32.0 - 36.0 g/dL   RDW 15.6 (H) 11.5 - 14.5 %   Platelets 223 150 - 440 K/uL  Comprehensive metabolic panel     Status: Abnormal   Collection Time: 07/26/15 12:50 AM  Result Value Ref Range   Sodium 127 (L) 135 - 145 mmol/L   Potassium 4.5 3.5 - 5.1 mmol/L   Chloride 91 (L) 101 - 111 mmol/L   CO2 26 22 - 32 mmol/L   Glucose, Bld 310 (H) 65 - 99 mg/dL   BUN 46 (H) 6 - 20 mg/dL   Creatinine, Ser 2.44 (H) 0.61 - 1.24 mg/dL   Calcium 7.6 (L) 8.9 - 10.3 mg/dL   Total Protein 6.2 (L) 6.5 - 8.1 g/dL   Albumin 1.9 (L) 3.5 - 5.0 g/dL   AST 22 15 - 41 U/L   ALT 12 (L) 17 - 63 U/L   Alkaline Phosphatase 85 38 - 126 U/L   Total Bilirubin <0.1 (L) 0.3 - 1.2 mg/dL   GFR calc non Af Amer 27 (L) >60 mL/min   GFR calc Af Amer 31 (L) >60 mL/min    Comment: (NOTE) The eGFR has been calculated using the CKD EPI equation. This calculation has not been validated in all clinical situations. eGFR's persistently <60 mL/min signify possible Chronic Kidney Disease.    Anion gap 10 5 - 15  Troponin I     Status: Abnormal   Collection Time: 07/26/15 12:50 AM  Result Value Ref Range   Troponin I 0.05 (H) <0.031 ng/mL    Comment: READ BACK AND VERIFIED [facility] YUAL AT 0132 07/26/2015 BY TFK        PERSISTENTLY INCREASED TROPONIN VALUES IN THE RANGE OF 0.04-0.49 ng/mL CAN BE SEEN IN:       -UNSTABLE ANGINA       -CONGESTIVE HEART FAILURE       -MYOCARDITIS       -CHEST TRAUMA       -ARRYHTHMIAS       -LATE PRESENTING MYOCARDIAL INFARCTION       -COPD   CLINICAL FOLLOW-UP RECOMMENDED.   Lactic acid, plasma     Status: None   Collection Time: 07/26/15 12:50 AM  Result Value Ref Range   Lactic Acid, Venous 1.6 0.5 - 2.0 mmol/L  Blood gas, arterial     Status: Abnormal   Collection Time: 07/26/15  3:35 AM  Result Value Ref Range   FIO2 0.40    Delivery systems VENTILATOR    Mode ASSIST CONTROL    VT 450 mL   LHR 16 resp/min   Peep/cpap 5.0 cm H20   pH, Arterial 7.53 (H)  7.350 - 7.450   pCO2 arterial 32 32.0 - 48.0 mmHg   pO2, Arterial 70 (L) 83.0 - 108.0 mmHg   Bicarbonate 26.7 21.0 - 28.0 mEq/L   Acid-Base Excess 4.4 (H) 0.0 - 3.0 mmol/L  O2 Saturation 95.7 %   Patient temperature 37.0    Collection site LEFT RADIAL    Sample type ARTERIAL DRAW    Allens test (pass/fail) POSITIVE (A) PASS   Mechanical Rate 16    Ct Head Wo Contrast  07/26/2015   CLINICAL DATA:  Altered mental status.  Febrile.  EXAM: CT HEAD WITHOUT CONTRAST  TECHNIQUE: Contiguous axial images were obtained from the base of the skull through the vertex without intravenous contrast.  COMPARISON:  04/12/2015  FINDINGS: There is no intracranial hemorrhage, mass or evidence of acute infarction. There is no extra-axial fluid collection. There is severe generalized atrophy. There is severe white matter hypodensity consistent with chronic microvascular ischemic disease. There is encephalomalacia due to remote right anterior frontal lobe infarction and remote right posteromedial occipital lobe infarction. No interval change is evident.  The right ocular lens is displaced into the center of the globe. This is unchanged. Visible paranasal sinuses are clear.  IMPRESSION: Severe generalized atrophy and chronic microvascular ischemic disease. Remote infarctions of the right anterior frontal lobe and right posteromedial occipital lobe. No acute intracranial findings.   Electronically Signed   By: Andreas Newport M.D.   On: 07/26/2015 01:32   Dg Chest Portable 1 View  07/26/2015   CLINICAL DATA:  Endotracheal intubation.  EXAM: PORTABLE CHEST - 1 VIEW  COMPARISON:  07/26/2015  FINDINGS: The endotracheal tube is 3.1 cm above the carina. There is no pneumothorax. There is no large effusion. Heart size is normal. There is no confluent airspace consolidation.  IMPRESSION: Satisfactory ET tube placement.   Electronically Signed   By: Andreas Newport M.D.   On: 07/26/2015 04:35   Dg Chest Portable 1  View  07/26/2015   CLINICAL DATA:  Acute onset of cough and chest rattling. Altered mental status. Initial encounter.  EXAM: PORTABLE CHEST - 1 VIEW  COMPARISON:  Chest radiograph performed 04/12/2015  FINDINGS: The lungs are well-aerated. Mild peribronchial thickening is noted. There is no evidence of focal opacification, pleural effusion or pneumothorax.  The cardiomediastinal silhouette is borderline normal in size. No acute osseous abnormalities are seen. Mild degenerative change is noted at the glenohumeral joints bilaterally.  IMPRESSION: Mild peribronchial thickening noted; lungs otherwise clear.   Electronically Signed   By: Garald Balding M.D.   On: 07/26/2015 01:24    Review of Systems  Unable to perform ROS: intubated    Blood pressure 96/65, pulse 73, temperature 98.5 F (36.9 C), temperature source Oral, resp. rate 16, SpO2 100 %. Physical Exam  Nursing note and vitals reviewed. Constitutional: He is oriented to person, place, and time. He appears well-developed and well-nourished.  HENT:  Head: Normocephalic and atraumatic.  Mouth/Throat: Oropharynx is clear and moist.  Eyes: Conjunctivae are normal. Pupils are equal, round, and reactive to light. No scleral icterus.  Neck: Neck supple. No JVD present. No tracheal deviation present. No thyromegaly present.  Cardiovascular: Normal rate, regular rhythm, normal heart sounds and intact distal pulses.  Exam reveals no gallop and no friction rub.   No murmur heard. Respiratory:  Gurgling and faint rhonchi bilaterally; patient is intubated  GI: Soft. Bowel sounds are normal. He exhibits no distension. There is no tenderness.  G-tube is in place  Genitourinary: Penis normal.  Foley catheter in place  Musculoskeletal: He exhibits no edema.  Lymphadenopathy:    He has no cervical adenopathy.  Neurological: He is alert and oriented to person, place, and time. No cranial nerve deficit.  Skin:  Skin is warm and dry. No rash noted. He is  not diaphoretic. No erythema.  Psychiatric:  Unable to assess due to intubation and sedation     Assessment/Plan This 63 year old male admitted for likely aspiration pneumonia as well as acute on chronic renal failure. 1. Pneumonia: Likely aspiration. We will treat patient for hospital-acquired pneumonia. Blood cultures have been obtained and the patient is started on broad spectrum antibiotics. Initial ABG shows mild alkalosis likely respiratory. We will adjust mechanical ventilation rate to improve acid-base status. The patient has copious secretions which I have ordered scopolamine patch. 2. Sepsis: The patient meets criteria for sepsis via leukocytosis and tachypnea. He is hemodynamically stable. 3. Acute on chronic kidney disease: Possibly due to low flow state secondary to sepsis. We will aggressively hydrate the patient and avoid nephrotoxic drugs. (Vancomycin dosed per pharmacy) 4. Hyponatremia: Likely secondary to pneumonia. Hydrate with normal saline. Tube feeds have been stopped temporarily and we will restrict free water. 5. Cardiovascular disease: Status post remote stroke; Continue Plavix  6. Hypertension: Continue metoprolol 7. Hypothyroidism: Continue Synthroid 8. Seizure disorder: Likely secondary to CVA; continue Keppra and Depakote 9. DVT prophylaxis: Heparin 10. GI prophylaxis: None until 48 hours on mechanical ventilation Patient is full code. Time spent on admission orders and patient care approximately 35 minutes  Harrie Foreman 07/26/2015, 5:14 AM

## 2015-07-26 NOTE — Consult Note (Addendum)
Palliative Care Update   I have examined patient and reviewed chart and have talked with his nurse.  Pt is full code and this is something [place] discussing with family.  Notes state that family was not reachable, but apparently they came in and visited briefly earlier.  He is currently making continuous gurgling noises or vocalizations as he breathes.  He is back on his usual bolus tube feedings that he was getting.  I wonder if he might need a reduced volume or continuous feedings to help reduce aspiration of tube feeding liquid.    Will follow and will talk with family soon.  Full note to follow.    Cammie Mcgee, MD  Addendum:  Pt seemed to be having some difficulty either with his secretions or else with his tube feedings --with some brief apnea noted.  I observed this and notified nursing and RT.   I went ahead and DCd bolus tube feedings and changed this to a low rate of continuous Jevity 1.5 Cal/fiber (same product as he had ordered).  Also asked RT to deep suction.   RT and I have talked after she deep suctioned him ---not much is suctioned. She did get him to cough and he actually has a robust / strong cough.  He looks so much better since this was done.   Perhaps he has a sleep apnea?  RT says he has been noted to have brief apnea but he also turned his eyes in one direction and they thought perhaps he had a brief seizure when he also had the brief apnea. His seizure meds are being given.  The strong cough seems to have cleared the loud gurgling and rattling sounds.  Will talk with family about goals of care --probably tomorrow.   Full Note to follow.    Cammie Mcgee, MD

## 2015-07-27 ENCOUNTER — Inpatient Hospital Stay: Payer: Medicare Other

## 2015-07-27 LAB — GLUCOSE, CAPILLARY
Glucose-Capillary: 43 mg/dL — CL (ref 65–99)
Glucose-Capillary: 79 mg/dL (ref 65–99)
Glucose-Capillary: 61 mg/dL — ABNORMAL LOW (ref 65–99)
Glucose-Capillary: 87 mg/dL (ref 65–99)
Glucose-Capillary: 105 mg/dL — ABNORMAL HIGH (ref 65–99)
Glucose-Capillary: 158 mg/dL — ABNORMAL HIGH (ref 65–99)
Glucose-Capillary: 127 mg/dL — ABNORMAL HIGH (ref 65–99)

## 2015-07-27 LAB — PROCALCITONIN: Procalcitonin: 0.24 ng/mL

## 2015-07-27 LAB — BASIC METABOLIC PANEL
ANION GAP: 7 (ref 5–15)
BUN: 45 mg/dL — AB (ref 6–20)
CO2: 29 mmol/L (ref 22–32)
Calcium: 8.2 mg/dL — ABNORMAL LOW (ref 8.9–10.3)
Chloride: 106 mmol/L (ref 101–111)
Creatinine, Ser: 2.43 mg/dL — ABNORMAL HIGH (ref 0.61–1.24)
GFR calc Af Amer: 31 mL/min — ABNORMAL LOW (ref >60)
GFR, EST NON AFRICAN AMERICAN: 27 mL/min — AB (ref >60)
Glucose, Bld: 72 mg/dL (ref 65–99)
Potassium: 4.3 mmol/L (ref 3.5–5.1)
Sodium: 142 mmol/L (ref 135–145)

## 2015-07-27 LAB — CBC
HEMATOCRIT: 33.6 % — AB (ref 40.0–52.0)
HEMOGLOBIN: 10.6 g/dL — AB (ref 13.0–18.0)
MCH: 27 pg (ref 26.0–34.0)
MCHC: 31.5 g/dL — AB (ref 32.0–36.0)
MCV: 85.7 fL (ref 80.0–100.0)
Platelets: 217 10*3/uL (ref 150–440)
RBC: 3.92 MIL/uL — ABNORMAL LOW (ref 4.40–5.90)
RDW: 16.1 % — ABNORMAL HIGH (ref 11.5–14.5)
WBC: 13.1 10*3/uL — ABNORMAL HIGH (ref 3.8–10.6)

## 2015-07-27 LAB — HEMOGLOBIN A1C: Hgb A1c MFr Bld: 6.9 % — ABNORMAL HIGH (ref 4.0–6.0)

## 2015-07-27 LAB — TSH: TSH: 1.291 u[IU]/mL (ref 0.350–4.500)

## 2015-07-27 MED ORDER — DEXTROSE 50 % IV SOLN
INTRAVENOUS | Status: AC
  Administered 2015-07-27: 25 mL via INTRAVENOUS
  Filled 2015-07-27: qty 50

## 2015-07-27 MED ORDER — PIPERACILLIN-TAZOBACTAM 3.375 G IVPB
3.3750 g | Freq: Three times a day (TID) | INTRAVENOUS | Status: DC
  Administered 2015-07-27 – 2015-07-28 (×2): 3.375 g via INTRAVENOUS
  Filled 2015-07-27 (×5): qty 50

## 2015-07-27 MED ORDER — DEXTROSE 50 % IV SOLN
25.0000 mL | Freq: Once | INTRAVENOUS | Status: AC
  Administered 2015-07-27: 25 mL via INTRAVENOUS

## 2015-07-27 MED ORDER — DEXTROSE 5 % IV SOLN
INTRAVENOUS | Status: DC
  Administered 2015-07-27 (×2): via INTRAVENOUS

## 2015-07-27 MED ORDER — SENNOSIDES-DOCUSATE SODIUM 8.6-50 MG PO TABS
1.0000 | ORAL_TABLET | Freq: Two times a day (BID) | ORAL | Status: DC
  Administered 2015-07-27: 1 via ORAL
  Filled 2015-07-27: qty 1

## 2015-07-27 MED ORDER — JEVITY 1.5 CAL/FIBER PO LIQD
1000.0000 mL | ORAL | Status: DC
  Administered 2015-07-28: 1000 mL

## 2015-07-27 MED ORDER — FREE WATER
25.0000 mL | Status: DC
  Administered 2015-07-27: 25 mL

## 2015-07-27 NOTE — Progress Notes (Signed)
Initial blood glucose this morning was 61 therefore per sidebar protocol 25ml of d50 was given.  Blood glucose rechecked and is 82. Patient was not symptomatic when blood glucose was 61, he was arousable to voice.

## 2015-07-27 NOTE — Progress Notes (Signed)
Inpatient Diabetes Program Recommendations  AACE/ADA: New Consensus Statement on Inpatient Glycemic Control (2013)  Target Ranges:  Prepandial:   less than 140 mg/dL      Peak postprandial:   less than 180 mg/dL (1-2 hours)      Critically ill patients:  140 - 180 mg/dL   Results for KWAKU, MOSTAFA (MRN [id number]) as of 07/27/2015 11:21  Ref. Range 07/26/2015 23:50 07/27/2015 04:51 07/27/2015 05:46 07/27/2015 07:32 07/27/2015 08:28  Glucose-Capillary Latest Ref Range: 65-99 mg/dL 119 (H) 43 (LL) 79 61 (L) 87   Reason for Visit: Low blood sugars this am  Diabetes history: not documented Outpatient Diabetes medications: none Current orders for Inpatient glycemic control: Novolog correction insulin moderate correction scale 0-15 units tid  Because of the patient's poor kidney function and recent episodes of hypoglycemia,  please consider changing Novolog correction to 0-9 units q4h.   When blood sugars improve and are consistently greater than /dl, and if he remains on feeds, consider changing Novolog to 2 units q4h.   MD- please add diabetes to the inpatient problem list.  Susette Racer, RN, BA, MHA, CDE Diabetes Coordinator Inpatient Diabetes Program  [phone] (Team Pager) [phone] [facility] Office) 07/27/2015 11:38 AM

## 2015-07-27 NOTE — Progress Notes (Signed)
[facility] -  at [facility]                                                                                                                                                                                            Patient Demographics   Antonius Hartlage, is a 64 y.o. male, DOB - 1951-05-03, ZOX:[id number]  Admit date - 07/26/2015   Admitting Physician Arnaldo Natal, MD  Outpatient Primary MD for the patient is FISCHER, TIMOTHY LEE, DO   LOS - 1  Subjective: Patient was extubated yesterday, has had hypoglycemia since yesterday. Is currently a little drowsy and unable to give me any review of systems    Review of Systems:   Patient currently on the ventilator and sleepy and unable to give any review of systems  Vitals:   Filed Vitals:   07/27/15 1000 07/27/15 1100 07/27/15 1109 07/27/15 1200  BP: 125/74 138/93 138/93 127/96  Pulse: 94 96 91 90  Temp:    97.8 F (36.6 C)  TempSrc:    Oral  Resp: 17 19  17   Height:      Weight:      SpO2: 96% 96%  100%    Wt Readings from Last 3 Encounters:  07/27/15 63.9 kg (140 lb 14 oz)     Intake/Output Summary (Last 24 hours) at 07/27/15 1209 Last data filed at 07/27/15 1200  Gross per 24 hour  Intake 2504.16 ml  Output   3400 ml  Net -895.84 ml    Physical Exam:   GENERAL:Critically ill appearing  HEAD, EYES, EARS, NOSE AND THROAT: Atraumatic, normocephalic. Extraocular muscles are intact. Pupils equal and reactive to light. Sclerae anicteric. No conjunctival injection. No oro-pharyngeal erythema.  NECK: Supple. There is no jugular venous distention. No bruits, no lymphadenopathy, no thyromegaly.  HEART: Regular rate and rhythm,. No murmurs, no rubs, no clicks.  LUNGS:On the ventilator , bilateral rhonchi  ABDOMEN: Soft, flat, nontender, nondistended. Has good bowel sounds. No hepatosplenomegaly appreciated.  EXTREMITIES: No evidence of any cyanosis, clubbing, or peripheral edema.  +2 pedal and  radial pulses bilaterally.  NEUROLOGIC: Sedated  SKIN: Moist and warm with no rashes appreciated.  Psych:Sedated  LN: No inguinal LN enlargement    Antibiotics   Anti-infectives    Start     Dose/Rate Route Frequency Ordered Stop   07/27/15 1600  piperacillin-tazobactam (ZOSYN) IVPB 3.375 g     3.375 g 12.5 mL/hr over 240 Minutes Intravenous 3 times per day 07/27/15 1016     07/26/15 1330  vancomycin (VANCOCIN) IVPB 750 mg/150 ml premix  Status:  Discontinued  750 mg 150 mL/hr over 60 Minutes Intravenous Every 24 hours 07/26/15 0636 07/27/15 1016   07/26/15 1130  piperacillin-tazobactam (ZOSYN) IVPB 4.5 g  Status:  Discontinued     4.5 g 25 mL/hr over 30 Minutes Intravenous 3 times per day 07/26/15 0636 07/26/15 0911   07/26/15 1130  piperacillin-tazobactam (ZOSYN) IVPB 4.5 g  Status:  Discontinued     4.5 g 25 mL/hr over 4 Hours Intravenous 3 times per day 07/26/15 0911 07/27/15 1016   07/26/15 0645  vancomycin (VANCOCIN) IVPB 750 mg/150 ml premix     750 mg 150 mL/hr over 60 Minutes Intravenous STAT 07/26/15 0636 07/26/15 0852   07/26/15 0330  levofloxacin (LEVAQUIN) IVPB 750 mg     750 mg 100 mL/hr over 90 Minutes Intravenous  Once 07/26/15 0322 07/26/15 0529   07/26/15 0330  piperacillin-tazobactam (ZOSYN) IVPB 3.375 g     3.375 g 12.5 mL/hr over 240 Minutes Intravenous  Once 07/26/15 0322 07/26/15 0359   07/26/15 0328  piperacillin-tazobactam (ZOSYN) 3.375 (3-0.375) G injection    Comments:  [place], [place]: cabinet override      07/26/15 0328 07/26/15 1529      Medications   Scheduled Meds: . antiseptic oral rinse  7 mL Mouth Rinse BID  . atorvastatin  40 mg Per Tube Daily  . Chlorhexidine Gluconate Cloth  6 each Topical Q0600  . cholecalciferol  2,000 Units Oral Daily  . clopidogrel  75 mg Per Tube Daily  . heparin  5,000 Units Subcutaneous 3 times per day  . insulin aspart  0-15 Units Subcutaneous 6 times per day  . ipratropium-albuterol  3 mL Nebulization 3  times per day  . latanoprost  1 drop Left Eye QHS  . levETIRAcetam  1,000 mg Per Tube BID  . levothyroxine  150 mcg Per Tube QAC breakfast  . metoprolol tartrate  25 mg Per Tube BID  . mupirocin ointment  1 application Nasal BID  . piperacillin-tazobactam (ZOSYN)  IV  3.375 g Intravenous 3 times per day  . polyethylene glycol  17 g Oral Daily  . Valproic Acid  750 mg Per Tube q morning - 10a   And  . Valproic Acid  1,000 mg Per Tube QHS   Continuous Infusions: . dextrose 75 mL/hr at 07/27/15 1110  . feeding supplement (JEVITY 1.5 CAL/FIBER) 1,000 mL (07/26/15 2218)   PRN Meds:.acetaminophen, bisacodyl, ondansetron **OR** ondansetron (ZOFRAN) IV, phenylephrine-shark liver oil-mineral oil-petrolatum   Data Review:   Micro Results Recent Results (from the past 240 hour(s))  Culture, blood (routine x 2)     Status: None (Preliminary result)   Collection Time: 07/26/15 12:50 AM  Result Value Ref Range Status   Specimen Description BLOOD RIGHT ASSIST CONTROL  Final   Special Requests BOTTLES DRAWN AEROBIC AND ANAEROBIC 5CC  Final   Culture NO GROWTH 1 DAY  Final   Report Status PENDING  Incomplete  Culture, blood (routine x 2)     Status: None (Preliminary result)   Collection Time: 07/26/15  2:22 AM  Result Value Ref Range Status   Specimen Description BLOOD BLOOD RIGHT FOREARM  Final   Special Requests   Final    BOTTLES DRAWN AEROBIC AND ANAEROBIC 1CCANAEROBIC, 7CCAEROBIC   Culture NO GROWTH 1 DAY  Final   Report Status PENDING  Incomplete  MRSA PCR Screening     Status: Abnormal   Collection Time: 07/26/15  5:54 AM  Result Value Ref Range Status   MRSA by PCR  POSITIVE (A) NEGATIVE Final    Comment:        The GeneXpert MRSA Assay (FDA approved for NASAL specimens only), is one component of a comprehensive MRSA colonization surveillance program. It is not intended to diagnose MRSA infection nor to guide or monitor treatment for MRSA infections. CRITICAL RESULT CALLED  TO, READ BACK BY AND VERIFIED WITH: Loistine Simas LINDSEY 07/26/15 0840 BOD   Urine culture     Status: None (Preliminary result)   Collection Time: 07/26/15  8:00 AM  Result Value Ref Range Status   Specimen Description URINE, RANDOM  Final   Special Requests Normal  Final   Culture NO GROWTH < 24 HOURS  Final   Report Status PENDING  Incomplete    Radiology Reports Ct Head Wo Contrast  07/26/2015   CLINICAL DATA:  Altered mental status.  Febrile.  EXAM: CT HEAD WITHOUT CONTRAST  TECHNIQUE: Contiguous axial images were obtained from the base of the skull through the vertex without intravenous contrast.  COMPARISON:  04/12/2015  FINDINGS: There is no intracranial hemorrhage, mass or evidence of acute infarction. There is no extra-axial fluid collection. There is severe generalized atrophy. There is severe white matter hypodensity consistent with chronic microvascular ischemic disease. There is encephalomalacia due to remote right anterior frontal lobe infarction and remote right posteromedial occipital lobe infarction. No interval change is evident.  The right ocular lens is displaced into the center of the globe. This is unchanged. Visible paranasal sinuses are clear.  IMPRESSION: Severe generalized atrophy and chronic microvascular ischemic disease. Remote infarctions of the right anterior frontal lobe and right posteromedial occipital lobe. No acute intracranial findings.   Electronically Signed   By: Ellery Plunk M.D.   On: 07/26/2015 01:32   Dg Chest Port 1 View  07/27/2015   CLINICAL DATA:  Respiratory failure, status post CVA, history of COPD and previous tobacco use.  EXAM: PORTABLE CHEST - 1 VIEW  COMPARISON:  Portable chest x-ray of July 26, 2015  FINDINGS: The lungs are slightly less well inflated today following extubation of the trachea. The interstitial markings are more conspicuous bilaterally. There is no alveolar infiltrate or pleural effusion. The heart is top-normal in size. The  pulmonary vascularity is not clearly engorged. The mediastinum is normal in width. There is degenerative change of the left shoulder. No acute bony thoracic abnormality is observed.  IMPRESSION: Mild bilateral hypoinflation. Coarse interstitial markings may reflect mild interstitial edema. There is no pneumonia.   Electronically Signed   By: David  [place] M.D.   On: 07/27/2015 07:24   Dg Chest Portable 1 View  07/26/2015   CLINICAL DATA:  Endotracheal intubation.  EXAM: PORTABLE CHEST - 1 VIEW  COMPARISON:  07/26/2015  FINDINGS: The endotracheal tube is 3.1 cm above the carina. There is no pneumothorax. There is no large effusion. Heart size is normal. There is no confluent airspace consolidation.  IMPRESSION: Satisfactory ET tube placement.   Electronically Signed   By: Ellery Plunk M.D.   On: 07/26/2015 04:35   Dg Chest Portable 1 View  07/26/2015   CLINICAL DATA:  Acute onset of cough and chest rattling. Altered mental status. Initial encounter.  EXAM: PORTABLE CHEST - 1 VIEW  COMPARISON:  Chest radiograph performed 04/12/2015  FINDINGS: The lungs are well-aerated. Mild peribronchial thickening is noted. There is no evidence of focal opacification, pleural effusion or pneumothorax.  The cardiomediastinal silhouette is borderline normal in size. No acute osseous abnormalities are seen. Mild degenerative change is  noted at the glenohumeral joints bilaterally.  IMPRESSION: Mild peribronchial thickening noted; lungs otherwise clear.   Electronically Signed   By: Roanna Raider M.D.   On: 07/26/2015 01:24     CBC  Recent Labs Lab 07/26/15 0050 07/27/15 0727  WBC 13.9* 13.1*  HGB 10.2* 10.6*  HCT 31.3* 33.6*  PLT 223 217  MCV 84.1 85.7  MCH 27.5 27.0  MCHC 32.7 31.5*  RDW 15.6* 16.1*    Chemistries   Recent Labs Lab 07/26/15 0050 07/27/15 0727  NA 127* 142  K 4.5 4.3  CL 91* 106  CO2 26 29  GLUCOSE 310* 72  BUN 46* 45*  CREATININE 2.44* 2.43*  CALCIUM 7.6* 8.2*  AST 22  --    ALT 12*  --   ALKPHOS 85  --   BILITOT <0.1*  --    ------------------------------------------------------------------------------------------------------------------ estimated creatinine clearance is 28.5 mL/min (by C-G formula based on Cr of 2.43). ------------------------------------------------------------------------------------------------------------------  Recent Labs  07/26/15 0655  HGBA1C 6.8*   ------------------------------------------------------------------------------------------------------------------ No results for input(s): CHOL, HDL, LDLCALC, TRIG, CHOLHDL, LDLDIRECT in the last 72 hours. ------------------------------------------------------------------------------------------------------------------  Recent Labs  07/27/15 0727  TSH 1.291   ------------------------------------------------------------------------------------------------------------------ No results for input(s): VITAMINB12, FOLATE, FERRITIN, TIBC, IRON, RETICCTPCT in the last 72 hours.  Coagulation profile No results for input(s): INR, PROTIME in the last 168 hours.  No results for input(s): DDIMER in the last 72 hours.  Cardiac Enzymes  Recent Labs Lab 07/26/15 0655 07/26/15 1045 07/26/15 1706  TROPONINI 0.04* 0.03 0.03   ------------------------------------------------------------------------------------------------------------------ Invalid input(s): POCBNP    Assessment & Plan   This 64 year old male admitted for likely aspiration pneumonia as well as acute on chronic renal failure. 1. Pneumonia: Likely aspiration.  Continue broad-spectrum anabiotic's with IV vancomycin and Zosyn.  Sounds rhonchus repeat a chest x-ray 2. Sepsis: Due to pneumonia continue aggressive IV anabiotic 3. Acute on chronic kidney disease:  Repeat renal function the morning 4. Hypoglycemia continue close monitoring of his blood glucose I will start him on D5 containing fluids. 5. History remote  stroke; Continue Plavix  6. Hypertension: Continue metoprolol 7. Hypothyroidism: Continue Synthroid 8. Seizure disorder: Likely secondary to CVA; continue Keppra and Depakote 9. DVT prophylaxis: Heparin 10. GI prophylaxis: None until 48 hours on mechanical ventilation     Code Status Orders        Start     Ordered   07/26/15 0506  Full code   Continuous     07/26/15 0505     I have updated his 2 sisters at bedside    Consults  pulmonary  DVT Prophylaxis   heparin  Lab Results  Component Value Date   PLT 217 07/27/2015     Time Spent in minutes  35 critical care time  Auburn Bilberry M.D on 07/27/2015 at 12:09 PM  Between 7am to 6pm - Pager - [phone]  After 6pm go to [email or web address] - password [email or web address] [facility]  [facility]   Office  [phone]

## 2015-07-27 NOTE — Progress Notes (Signed)
Nutrition Follow-up    INTERVENTION:  EN: agree with trial of continuous feedings; it is possible that pt may tolerate continous feedings better than bolus feedings but does not  remove the risk of aspiration. Pt may aspirate regardless; recommend strict aspiration preautions at all times. Goal rate to meet nutritional needs is 53 ml/hr (1908 kcals, 81 g of protein and 967 mL of free water), needs increased due to acute illness (respiratory failure, sepsis) , also wound healing; received verbal orders from MD Kasa to titrate TF as tolerated to goal rate. Orders modified.  Disucssed IVF during ICU rounds, MD Kasa to discontinue NS infusion. Recommend free water flush of 30 ml/hr (additional 720 mL of free water) Nutrition related medication management: recommend starting constipation prevention protocol; discussed during ICU rounds   NUTRITION DIAGNOSIS:   Inadequate oral intake related to acute illness as evidenced by NPO status.   GOAL:   Provide needs based on ASPEN/SCCM guidelines  MONITOR:    (Energy Intake, Anthropometrics, Digestive System, Electrolyte/Renal Profile, Glucose Profile)  ASSESSMENT:   Pt s/p extubation yesterday, on room air; pt received D50 this AM for hypoglycemia  EN: TF changed to Jevity 1.5 continuous feedings at rate of 15 ml/hr by MD yesterday evening  Digestive System: residuals 10 mL  Skin:   (stage II coccyx)  Last BM:   unknown  Electrolyte and Renal Profile:  Recent Labs Lab 07/26/15 0050 07/27/15 0727  BUN 46* 45*  CREATININE 2.44* 2.43*  NA 127* 142  K 4.5 4.3   Glucose Profile:   Recent Labs  07/27/15 0732 07/27/15 0828 07/27/15 1151  GLUCAP 61* 87 105*   Meds: NS at 50 ml/hr, ss novolog, dulcolax suppository  Height:   Ht Readings from Last 1 Encounters:  07/26/15  (1.778 m)    Weight:   Wt Readings from Last 1 Encounters:  07/27/15 140 lb 14 oz (63.9 kg)    Ideal Body Weight:     BMI:  Body mass index is  20.21 kg/(m^2).  Estimated Nutritional Needs:   Kcal:  1901-2246 kcals (BEE 1440, 1.2 AF, 1.1-1.3 IF)   Protein:  71-89 g (1.2-1.5 g/kg)   Fluid:  1475-1770 mL (25-30 ml/kg)   HIGH Care Level  Romelle Starcher MS, RD, LDN [phone] Pager

## 2015-07-28 ENCOUNTER — Inpatient Hospital Stay
Admit: 2015-07-28 | Discharge: 2015-07-28 | Disposition: A | Discharge status: Home / Self Care | Payer: Medicare Other | Attending: Cardiovascular Disease | Admitting: Cardiovascular Disease

## 2015-07-28 ENCOUNTER — Inpatient Hospital Stay: Payer: Medicare Other

## 2015-07-28 DIAGNOSIS — R008 Other abnormalities of heart beat: Secondary | ICD-10-CM

## 2015-07-28 DIAGNOSIS — R4182 Altered mental status, unspecified: Secondary | ICD-10-CM

## 2015-07-28 LAB — GLUCOSE, CAPILLARY
Glucose-Capillary: 107 mg/dL — ABNORMAL HIGH (ref 65–99)
Glucose-Capillary: 114 mg/dL — ABNORMAL HIGH (ref 65–99)
Glucose-Capillary: 117 mg/dL — ABNORMAL HIGH (ref 65–99)
Glucose-Capillary: 118 mg/dL — ABNORMAL HIGH (ref 65–99)
Glucose-Capillary: 118 mg/dL — ABNORMAL HIGH (ref 65–99)
Glucose-Capillary: 176 mg/dL — ABNORMAL HIGH (ref 65–99)
Glucose-Capillary: 57 mg/dL — ABNORMAL LOW (ref 65–99)

## 2015-07-28 LAB — BASIC METABOLIC PANEL
Anion gap: 8 (ref 5–15)
BUN: 36 mg/dL — ABNORMAL HIGH (ref 6–20)
CO2: 29 mmol/L (ref 22–32)
Calcium: 8.1 mg/dL — ABNORMAL LOW (ref 8.9–10.3)
Chloride: 104 mmol/L (ref 101–111)
Creatinine, Ser: 2.23 mg/dL — ABNORMAL HIGH (ref 0.61–1.24)
GFR calc Af Amer: 35 mL/min — ABNORMAL LOW (ref >60)
GFR calc non Af Amer: 30 mL/min — ABNORMAL LOW (ref >60)
Glucose, Bld: 79 mg/dL (ref 65–99)
Potassium: 4.6 mmol/L (ref 3.5–5.1)
SODIUM: 141 mmol/L (ref 135–145)

## 2015-07-28 LAB — CBC
HEMATOCRIT: 32.2 % — AB (ref 40.0–52.0)
Hemoglobin: 10.4 g/dL — ABNORMAL LOW (ref 13.0–18.0)
MCH: 27.7 pg (ref 26.0–34.0)
MCHC: 32.3 g/dL (ref 32.0–36.0)
MCV: 85.8 fL (ref 80.0–100.0)
Platelets: 233 10*3/uL (ref 150–440)
RBC: 3.75 MIL/uL — ABNORMAL LOW (ref 4.40–5.90)
RDW: 16.2 % — AB (ref 11.5–14.5)
WBC: 9.1 10*3/uL (ref 3.8–10.6)

## 2015-07-28 LAB — C DIFFICILE QUICK SCREEN W PCR REFLEX
C Diff antigen: NEGATIVE
C Diff interpretation: NEGATIVE
C Diff toxin: NEGATIVE

## 2015-07-28 LAB — MAGNESIUM: Magnesium: 2.5 mg/dL — ABNORMAL HIGH (ref 1.7–2.4)

## 2015-07-28 LAB — URINE CULTURE: Special Requests: NORMAL

## 2015-07-28 LAB — TROPONIN I: Troponin I: <0.03 ng/mL (ref <0.031)

## 2015-07-28 LAB — PHOSPHORUS: PHOSPHORUS: 4 mg/dL (ref 2.5–4.6)

## 2015-07-28 LAB — VALPROIC ACID LEVEL: VALPROIC ACID LVL: 66 ug/mL (ref 50.0–100.0)

## 2015-07-28 MED ORDER — LABETALOL HCL 5 MG/ML IV SOLN
10.0000 mg | INTRAVENOUS | Status: DC | PRN
  Administered 2015-07-28: 10 mg via INTRAVENOUS
  Filled 2015-07-28 (×3): qty 4

## 2015-07-28 MED ORDER — DEXTROSE 50 % IV SOLN
25.0000 mL | Freq: Once | INTRAVENOUS | Status: AC
  Administered 2015-07-28: 25 mL via INTRAVENOUS
  Filled 2015-07-28: qty 50

## 2015-07-28 MED ORDER — SCOPOLAMINE 1 MG/3DAYS TD PT72
1.0000 | MEDICATED_PATCH | TRANSDERMAL | Status: DC
  Administered 2015-07-28: 1.5 mg via TRANSDERMAL
  Filled 2015-07-28 (×2): qty 1

## 2015-07-28 MED ORDER — JEVITY 1.5 CAL/FIBER PO LIQD
1000.0000 mL | ORAL | Status: DC
  Administered 2015-07-29 – 2015-07-31 (×3): 1000 mL

## 2015-07-28 MED ORDER — INSULIN ASPART 100 UNIT/ML ~~LOC~~ SOLN
0.0000 [IU] | SUBCUTANEOUS | Status: DC
  Administered 2015-07-28 – 2015-07-29 (×3): 2 [IU] via SUBCUTANEOUS
  Administered 2015-07-29: 22:00:00 1 [IU] via SUBCUTANEOUS
  Administered 2015-07-30: 2 [IU] via SUBCUTANEOUS
  Filled 2015-07-28 (×4): qty 2

## 2015-07-28 MED ORDER — SENNOSIDES-DOCUSATE SODIUM 8.6-50 MG PO TABS: 1.0000 | ORAL_TABLET | Freq: Every day | ORAL | Status: DC

## 2015-07-28 MED ORDER — FREE WATER
30.0000 mL | Status: DC
  Administered 2015-07-28: 30 mL

## 2015-07-28 NOTE — Care Management Important Message (Signed)
Important Message  Patient Details  Name: [name] MRN: [id number] Date of Birth: May 24, 1951   Medicare Important Message Given:       Collie Siad, RN 07/28/2015, 9:26 AM

## 2015-07-28 NOTE — Progress Notes (Signed)
RN notified Dr. Allena Katz that patient is having trigeminy and R on T per cardiac monitor.  Dr. Allena Katz stated "make sure he had a magnesium level and he can be moved to any med surg floor with tele."  [name]

## 2015-07-28 NOTE — Progress Notes (Signed)
[facility] [place] [facility]                                                                                                                                                                                            Patient Demographics   [name], is a 64 y.o. male, DOB - 05/09/1951, UJW:[id number]  Admit date - 07/26/2015   Admitting Physician Arnaldo Natal, MD  Outpatient Primary MD for the patient is FISCHER, TIMOTHY LEE, DO   LOS - 2  Subjective: This patient I don't think he can handle the patient doing better but still sounds rhonchus he is unable to give any meaningful review of systems but denies any chest pain    Review of Systems:   Due to his mental status unable to provide much review of systems  Vitals:   Filed Vitals:   07/28/15 0900 07/28/15 1000 07/28/15 1045 07/28/15 1200  BP: 109/69 130/79 121/92 141/91  Pulse: 85 90 94 91  Temp:    97.9 F (36.6 C)  TempSrc:    Oral  Resp: 22 14  15   Height:      Weight:      SpO2: 97% 97%  100%    Wt Readings from Last 3 Encounters:  07/28/15 61.9 kg (136 lb 7.4 oz)     Intake/Output Summary (Last 24 hours) at 07/28/15 1235 Last data filed at 07/28/15 1200  Gross per 24 hour  Intake 3376.5 ml  Output   2375 ml  Net 1001.5 ml    Physical Exam:   GENERAL: Chronically ill-appearing HEAD, EYES, EARS, NOSE AND THROAT: Atraumatic, normocephalic. Extraocular muscles are intact. Pupils equal and reactive to light. Sclerae anicteric. No conjunctival injection. No oro-pharyngeal erythema.  NECK: Supple. There is no jugular venous distention. No bruits, no lymphadenopathy, no thyromegaly.  HEART: Regular rate and rhythm,. No murmurs, no rubs, no clicks.  LUNGS:On the ventilator , bilateral rhonchi  ABDOMEN: Soft, flat, nontender, nondistended. Has good bowel sounds. No hepatosplenomegaly appreciated.  EXTREMITIES: No evidence of any cyanosis, clubbing, or peripheral edema.  +2  pedal and radial pulses bilaterally.  NEUROLOGIC: Sedated  SKIN: Moist and warm with no rashes appreciated.  Psych:Sedated  LN: No inguinal LN enlargement    Antibiotics   Anti-infectives    Start     Dose/Rate Route Frequency Ordered Stop   07/27/15 1600  piperacillin-tazobactam (ZOSYN) IVPB 3.375 g  Status:  Discontinued     3.375 g 12.5 mL/hr over 240 Minutes Intravenous 3 times per day 07/27/15 1016 07/28/15 0753   07/26/15 1330  vancomycin (VANCOCIN) IVPB 750  mg/150 ml premix  Status:  Discontinued     750 mg 150 mL/hr over 60 Minutes Intravenous Every 24 hours 07/26/15 0636 07/27/15 1016   07/26/15 1130  piperacillin-tazobactam (ZOSYN) IVPB 4.5 g  Status:  Discontinued     4.5 g 25 mL/hr over 30 Minutes Intravenous 3 times per day 07/26/15 0636 07/26/15 0911   07/26/15 1130  piperacillin-tazobactam (ZOSYN) IVPB 4.5 g  Status:  Discontinued     4.5 g 25 mL/hr over 4 Hours Intravenous 3 times per day 07/26/15 0911 07/27/15 1016   07/26/15 0645  vancomycin (VANCOCIN) IVPB 750 mg/150 ml premix     750 mg 150 mL/hr over 60 Minutes Intravenous STAT 07/26/15 0636 07/26/15 0852   07/26/15 0330  levofloxacin (LEVAQUIN) IVPB 750 mg     750 mg 100 mL/hr over 90 Minutes Intravenous  Once 07/26/15 0322 07/26/15 0529   07/26/15 0330  piperacillin-tazobactam (ZOSYN) IVPB 3.375 g     3.375 g 12.5 mL/hr over 240 Minutes Intravenous  Once 07/26/15 0322 07/26/15 0359   07/26/15 0328  piperacillin-tazobactam (ZOSYN) 3.375 (3-0.375) G injection    Comments:  [place], [place]: cabinet override      07/26/15 0328 07/26/15 1529      Medications   Scheduled Meds: . antiseptic oral rinse  7 mL Mouth Rinse BID  . atorvastatin  40 mg Per Tube Daily  . Chlorhexidine Gluconate Cloth  6 each Topical Q0600  . cholecalciferol  2,000 Units Oral Daily  . clopidogrel  75 mg Per Tube Daily  . heparin  5,000 Units Subcutaneous 3 times per day  . insulin aspart  0-9 Units Subcutaneous 6 times per day  .  ipratropium-albuterol  3 mL Nebulization 3 times per day  . latanoprost  1 drop Left Eye QHS  . levETIRAcetam  1,000 mg Per Tube BID  . levothyroxine  150 mcg Per Tube QAC breakfast  . metoprolol tartrate  25 mg Per Tube BID  . mupirocin ointment  1 application Nasal BID  . scopolamine  1 patch Transdermal Q72H  . Valproic Acid  750 mg Per Tube q morning - 10a   And  . Valproic Acid  1,000 mg Per Tube QHS   Continuous Infusions: . feeding supplement (JEVITY 1.5 CAL/FIBER)    . free water     PRN Meds:.acetaminophen, labetalol, ondansetron **OR** ondansetron (ZOFRAN) IV, phenylephrine-shark liver oil-mineral oil-petrolatum   Data Review:   Micro Results Recent Results (from the past 240 hour(s))  Culture, blood (routine x 2)     Status: None (Preliminary result)   Collection Time: 07/26/15 12:50 AM  Result Value Ref Range Status   Specimen Description BLOOD RIGHT ASSIST CONTROL  Final   Special Requests BOTTLES DRAWN AEROBIC AND ANAEROBIC 5CC  Final   Culture NO GROWTH 2 DAYS  Final   Report Status PENDING  Incomplete  Culture, blood (routine x 2)     Status: None (Preliminary result)   Collection Time: 07/26/15  2:22 AM  Result Value Ref Range Status   Specimen Description BLOOD BLOOD RIGHT FOREARM  Final   Special Requests   Final    BOTTLES DRAWN AEROBIC AND ANAEROBIC 1CCANAEROBIC, 7CCAEROBIC   Culture NO GROWTH 2 DAYS  Final   Report Status PENDING  Incomplete  MRSA PCR Screening     Status: Abnormal   Collection Time: 07/26/15  5:54 AM  Result Value Ref Range Status   MRSA by PCR POSITIVE (A) NEGATIVE Final    Comment:  The GeneXpert MRSA Assay (FDA approved for NASAL specimens only), is one component of a comprehensive MRSA colonization surveillance program. It is not intended to diagnose MRSA infection nor to guide or monitor treatment for MRSA infections. CRITICAL RESULT CALLED TO, READ BACK BY AND VERIFIED WITH: Loistine Simas LINDSEY 07/26/15 0840 BOD    Urine culture     Status: None (Preliminary result)   Collection Time: 07/26/15  8:00 AM  Result Value Ref Range Status   Specimen Description URINE, RANDOM  Final   Special Requests Normal  Final   Culture NO GROWTH < 24 HOURS  Final   Report Status PENDING  Incomplete  C difficile quick scan w PCR reflex     Status: None   Collection Time: 07/28/15  8:17 AM  Result Value Ref Range Status   C Diff antigen NEGATIVE NEGATIVE Final   C Diff toxin NEGATIVE NEGATIVE Final   C Diff interpretation Negative for C. difficile  Final    Radiology Reports Dg Chest 1 View  07/28/2015   CLINICAL DATA:  Shortness of Breath  EXAM: CHEST  1 VIEW  COMPARISON:  July 27, 2015  FINDINGS: There is no edema or consolidation. The heart size and pulmonary vascularity are normal. No adenopathy. There is arthropathy in each shoulder.  IMPRESSION: No edema or consolidation.   Electronically Signed   By: Bretta Bang III M.D.   On: 07/28/2015 07:25   Ct Head Wo Contrast  07/26/2015   CLINICAL DATA:  Altered mental status.  Febrile.  EXAM: CT HEAD WITHOUT CONTRAST  TECHNIQUE: Contiguous axial images were obtained from the base of the skull through the vertex without intravenous contrast.  COMPARISON:  04/12/2015  FINDINGS: There is no intracranial hemorrhage, mass or evidence of acute infarction. There is no extra-axial fluid collection. There is severe generalized atrophy. There is severe white matter hypodensity consistent with chronic microvascular ischemic disease. There is encephalomalacia due to remote right anterior frontal lobe infarction and remote right posteromedial occipital lobe infarction. No interval change is evident.  The right ocular lens is displaced into the center of the globe. This is unchanged. Visible paranasal sinuses are clear.  IMPRESSION: Severe generalized atrophy and chronic microvascular ischemic disease. Remote infarctions of the right anterior frontal lobe and right posteromedial  occipital lobe. No acute intracranial findings.   Electronically Signed   By: Ellery Plunk M.D.   On: 07/26/2015 01:32   Dg Chest Port 1 View  07/27/2015   CLINICAL DATA:  Respiratory failure, status post CVA, history of COPD and previous tobacco use.  EXAM: PORTABLE CHEST - 1 VIEW  COMPARISON:  Portable chest x-ray of July 26, 2015  FINDINGS: The lungs are slightly less well inflated today following extubation of the trachea. The interstitial markings are more conspicuous bilaterally. There is no alveolar infiltrate or pleural effusion. The heart is top-normal in size. The pulmonary vascularity is not clearly engorged. The mediastinum is normal in width. There is degenerative change of the left shoulder. No acute bony thoracic abnormality is observed.  IMPRESSION: Mild bilateral hypoinflation. Coarse interstitial markings may reflect mild interstitial edema. There is no pneumonia.   Electronically Signed   By: David  [place] M.D.   On: 07/27/2015 07:24   Dg Chest Portable 1 View  07/26/2015   CLINICAL DATA:  Endotracheal intubation.  EXAM: PORTABLE CHEST - 1 VIEW  COMPARISON:  07/26/2015  FINDINGS: The endotracheal tube is 3.1 cm above the carina. There is no pneumothorax. There is no  large effusion. Heart size is normal. There is no confluent airspace consolidation.  IMPRESSION: Satisfactory ET tube placement.   Electronically Signed   By: Ellery Plunk M.D.   On: 07/26/2015 04:35   Dg Chest Portable 1 View  07/26/2015   CLINICAL DATA:  Acute onset of cough and chest rattling. Altered mental status. Initial encounter.  EXAM: PORTABLE CHEST - 1 VIEW  COMPARISON:  Chest radiograph performed 04/12/2015  FINDINGS: The lungs are well-aerated. Mild peribronchial thickening is noted. There is no evidence of focal opacification, pleural effusion or pneumothorax.  The cardiomediastinal silhouette is borderline normal in size. No acute osseous abnormalities are seen. Mild degenerative change is noted at the  glenohumeral joints bilaterally.  IMPRESSION: Mild peribronchial thickening noted; lungs otherwise clear.   Electronically Signed   By: Roanna Raider M.D.   On: 07/26/2015 01:24     CBC  Recent Labs Lab 07/26/15 0050 07/27/15 0727 07/28/15 0320  WBC 13.9* 13.1* 9.1  HGB 10.2* 10.6* 10.4*  HCT 31.3* 33.6* 32.2*  PLT 223 217 233  MCV 84.1 85.7 85.8  MCH 27.5 27.0 27.7  MCHC 32.7 31.5* 32.3  RDW 15.6* 16.1* 16.2*    Chemistries   Recent Labs Lab 07/26/15 0050 07/27/15 0727 07/28/15 0320  NA 127* 142 141  K 4.5 4.3 4.6  CL 91* 106 104  CO2 GLUCOSE 310* 72 79  BUN 46* 45* 36*  CREATININE 2.44* 2.43* 2.23*  CALCIUM 7.6* 8.2* 8.1*  AST 22  --   --   ALT 12*  --   --   ALKPHOS 85  --   --   BILITOT <0.1*  --   --    ------------------------------------------------------------------------------------------------------------------ estimated creatinine clearance is 30.1 mL/min (by C-G formula based on Cr of 2.23). ------------------------------------------------------------------------------------------------------------------  Recent Labs  07/26/15 0655 07/27/15 0727  HGBA1C 6.8* 6.9*   ------------------------------------------------------------------------------------------------------------------ No results for input(s): CHOL, HDL, LDLCALC, TRIG, CHOLHDL, LDLDIRECT in the last 72 hours. ------------------------------------------------------------------------------------------------------------------  Recent Labs  07/27/15 0727  TSH 1.291   ------------------------------------------------------------------------------------------------------------------ No results for input(s): VITAMINB12, FOLATE, FERRITIN, TIBC, IRON, RETICCTPCT in the last 72 hours.  Coagulation profile No results for input(s): INR, PROTIME in the last 168 hours.  No results for input(s): DDIMER in the last 72 hours.  Cardiac Enzymes  Recent Labs Lab 07/26/15 0655  07/26/15 1045 07/26/15 1706  TROPONINI 0.04* 0.03 0.03   ------------------------------------------------------------------------------------------------------------------ Invalid input(s): POCBNP    Assessment & Plan   This 64 year old male admitted for likely aspiration pneumonia as well as acute on chronic renal failure. 1. Pneumonia: Likely aspiration.  Continue Zosyn DC vancomycin chest x-ray is negative he continued to sound very rhonchus I'll start him on a scopolamine patch 2. Sepsis: Due to pneumonia continue Zosyn 3. Acute on chronic kidney disease:  Improved with IV hydration 4. Hypoglycemia resolved, DC D5 tube feeds increased 5. History remote stroke; Continue Plavix  6. Hypertension: Continue metoprolol 7. Hypothyroidism: Continue Synthroid 8. Seizure disorder: Likely secondary to CVA; continue Keppra and Depakote 9. DVT prophylaxis: Heparin      Code Status Orders        Start     Ordered   07/26/15 0506  Full code   Continuous     07/26/15 0505     I have updated his 2 sisters at bedside    Consults  pulmonary  DVT Prophylaxis   heparin  Lab Results  Component Value Date   PLT 233 07/28/2015  Transfer to floor  Time Spent in minutes  35 min Auburn Bilberry M.D on 07/28/2015 at 12:35 PM  Between 7am to 6pm - Pager - [phone]  After 6pm go to [email or web address] - password [email or web address] [facility]  [facility]   Office  [phone]

## 2015-07-28 NOTE — Progress Notes (Signed)
Nutrition Follow-up    INTERVENTION:   EN: recommend increasing TF to goal rate of 53 ml/hr, free water flush of 30 ml/hr, continue to assess. Strict aspiration precautions   NUTRITION DIAGNOSIS:   Inadequate oral intake related to acute illness as evidenced by NPO status. Being addressed as titrating TF to goal rate    GOAL:   Provide needs based on ASPEN/SCCM guidelines   MONITOR:    (Energy Intake, Anthropometrics, Digestive System, Electrolyte/Renal Profile, Glucose Profile)  ASSESSMENT:    Pt with episodes of hypoglycemia, on D5 infusion, only on sliding scale insulin; per Brittney RN, pt was noted to be disconnected from TF during the middle of the night when hypoglycemic, pt reconnected to feedings, FSBS wdl since  EN: tolerating Jevity 1.5 TF at rate of 35 ml/hr  Digestive System: no signs of TF intolerance, noted liquid stool, C.diff negative  Skin:   (stage II coccyx)  Last BM:   8/5  Electrolyte and Renal Profile:  Recent Labs Lab 07/26/15 0050 07/27/15 0727 07/28/15 0320  BUN 46* 45* 36*  CREATININE 2.44* 2.43* 2.23*  NA 127* 142 141  K 4.5 4.3 4.6   Glucose Profile:  Recent Labs  07/28/15 0551 07/28/15 0741 07/28/15 1215  GLUCAP 107* 117* 176*   Protein Profile:  Recent Labs Lab 07/26/15 0050  ALBUMIN 1.9*   Meds: D5 to be discontinued, ss nvoolog  Height:   Ht Readings from Last 1 Encounters:  07/26/15  (1.778 m)    Weight:   Wt Readings from Last 1 Encounters:  07/28/15 136 lb 7.4 oz (61.9 kg)   Filed Weights   07/26/15 0600 07/27/15 0500 07/28/15 0455  Weight: 129 lb 6.6 oz (58.7 kg) 140 lb 14 oz (63.9 kg) 136 lb 7.4 oz (61.9 kg)    BMI:  Body mass index is 19.58 kg/(m^2).  Estimated Nutritional Needs:   Kcal:  1901-2246 kcals (BEE 1440, 1.2 AF, 1.1-1.3 IF)   Protein:  71-89 g (1.2-1.5 g/kg)   Fluid:  1475-1770 mL (25-30 ml/kg)   MODERATE Care Level  Romelle Starcher MS, RD, LDN [phone] Pager

## 2015-07-28 NOTE — Progress Notes (Signed)
Patient does not tolerate cpt.

## 2015-07-28 NOTE — Progress Notes (Signed)
RN called Debbie(patient family member) and made her aware that patient was moved to room 126.

## 2015-07-28 NOTE — Progress Notes (Signed)
Patient alert to self and place. Denies pain. VSS. No respiratory distress noted. Patient moved to room 126 by bed with Marcelino Duster, orderly.  Off unit tele monitor on and verified with Aurther Loft, tele monitor prior to moving to floor.

## 2015-07-28 NOTE — Progress Notes (Signed)
Report called to Donna, RN on 1C.  

## 2015-07-28 NOTE — Progress Notes (Signed)
Dr. Welton Flakes present and stated "patient can be moved to the floor and will you order and echocardiogram."

## 2015-07-28 NOTE — Progress Notes (Signed)
[name] is a 64 y.o. male  [id number]  Primary Cardiologist: Adrian Blackwater Reason for Consultation: Abnormal EKG  HPI: This is a 64 year old African-American male with a past medical history of left-sided CVA recently presented to the emergency room on 07/26/2015 with respiratory failure got intubated and was extubated now but developed some occasional trigeminy and frequent PVCs. He also had a EKG done which showed some ST elevation in lead 3 and I was asked to evaluate the patient. Patient is not alert and oriented and has been confused for a while after the stroke. The patient appears to be comfortable.   Review of Systems: Unable to evaluate.   Past Medical History  Diagnosis Date  . CVA (cerebral vascular accident)   . COPD (chronic obstructive pulmonary disease)   . Hemiplegia   . Dysphagia   . Gastrostomy in place   . Muscle weakness   . Urinary tract infection   . Hyperkalemia   . Dementia     Medications Prior to Admission  Medication Sig Dispense Refill  . acetaminophen (TYLENOL) 650 MG suppository Place 650 mg rectally every 8 (eight) hours as needed.    Marland Kitchen atorvastatin (LIPITOR) 40 MG tablet 40 mg by Feeding Tube route daily.    . bisacodyl (DULCOLAX) 10 MG suppository Place 10 mg rectally as needed for moderate constipation.    . cefTRIAXone (ROCEPHIN) 1 G injection Inject 1 g into the muscle once.    . Cholecalciferol (VITAMIN D3) LIQD 2,000 Units by Feeding Tube route daily.    . clopidogrel (PLAVIX) 75 MG tablet 75 mg by Feeding Tube route daily.    Marland Kitchen ipratropium-albuterol (DUONEB) 0.5-2.5 (3) MG/3ML SOLN Take 3 mLs by nebulization every 8 (eight) hours. For 5 days    . latanoprost (XALATAN) 0.005 % ophthalmic solution Place 1 drop into the left eye at bedtime.    . levETIRAcetam (KEPPRA) 100 MG/ML solution Take 1,000 mg by mouth 2 (two) times daily.    Marland Kitchen levothyroxine (SYNTHROID, LEVOTHROID) 150 MCG tablet 150 mcg by Feeding Tube route daily.    .  metoprolol tartrate (LOPRESSOR) 25 MG tablet 25 mg by Feeding Tube route 2 (two) times daily.    . phenylephrine-shark liver oil-mineral oil-petrolatum (PREPARATION H) 0.25-3-14-71.9 % rectal ointment Place 1 application rectally 2 (two) times daily as needed for hemorrhoids.    . polyethylene glycol (MIRALAX / GLYCOLAX) packet Take 17 g by mouth daily. Mix in 4-8 oz of fluid    . valproate (DEPAKENE) 250 MG/5ML syrup Take 750-1,000 mg by mouth 2 (two) times daily.  am,  bedtime       . antiseptic oral rinse  7 mL Mouth Rinse BID  . atorvastatin  40 mg Per Tube Daily  . Chlorhexidine Gluconate Cloth  6 each Topical Q0600  . cholecalciferol  2,000 Units Oral Daily  . clopidogrel  75 mg Per Tube Daily  . heparin  5,000 Units Subcutaneous 3 times per day  . insulin aspart  0-9 Units Subcutaneous 6 times per day  . ipratropium-albuterol  3 mL Nebulization 3 times per day  . latanoprost  1 drop Left Eye QHS  . levETIRAcetam  1,000 mg Per Tube BID  . levothyroxine  150 mcg Per Tube QAC breakfast  . metoprolol tartrate  25 mg Per Tube BID  . mupirocin ointment  1 application Nasal BID  . scopolamine  1 patch Transdermal Q72H  . Valproic Acid  750 mg Per Tube q morning -  10a   And  . Valproic Acid  1,000 mg Per Tube QHS    Infusions: . feeding supplement (JEVITY 1.5 CAL/FIBER)    . free water      No Known Allergies  History   Social History  . Marital Status: Divorced    Spouse Name: N/A  . Number of Children: N/A  . Years of Education: N/A   Occupational History  . Not on file.   Social History Main Topics  . Smoking status: Former Smoker    Types: Cigarettes  . Smokeless tobacco: Not on file  . Alcohol Use: No  . Drug Use: No  . Sexual Activity: Not on file   Other Topics Concern  . Not on file   Social History Narrative  . No narrative on file    Family History  Problem Relation Age of Onset  .       PHYSICAL EXAM: Filed Vitals:   07/28/15 1700   BP: 141/76  Pulse: 72  Temp:   Resp: 19     Intake/Output Summary (Last 24 hours) at 07/28/15 1811 Last data filed at 07/28/15 1700  Gross per 24 hour  Intake   3144 ml  Output   1525 ml  Net   1619 ml    General:  Well appearing. No respiratory difficulty HEENT: normal Neck: supple. no JVD. Carotids 2+ bilat; no bruits. No lymphadenopathy or thryomegaly appreciated. Cor: PMI nondisplaced. Regular rate & rhythm. No rubs, gallops or murmurs. Lungs: clear Abdomen: soft, nontender, nondistended. No hepatosplenomegaly. No bruits or masses. Good bowel sounds. Extremities: no cyanosis, clubbing, rash, edema Neuro: alert & oriented x 3, cranial nerves grossly intact. moves all 4 extremities w/o difficulty. Affect pleasant.  ECG: Baseline EKG revealed normal sinus rhythm about 80 bpm, with ST elevation 1 mm in lead 3 only. Rest of the leads have no significant ST changes.  Results for orders placed or performed during the hospital encounter of 07/26/15 (from the past 24 hour(s))  Glucose, capillary     Status: Abnormal   Collection Time: 07/27/15  7:42 PM  Result Value Ref Range   Glucose-Capillary 127 (H) 65 - 99 mg/dL  Glucose, capillary     Status: Abnormal   Collection Time: 07/28/15 12:20 AM  Result Value Ref Range   Glucose-Capillary 118 (H) 65 - 99 mg/dL  CBC     Status: Abnormal   Collection Time: 07/28/15  3:20 AM  Result Value Ref Range   WBC 9.1 3.8 - 10.6 K/uL   RBC 3.75 (L) 4.40 - 5.90 MIL/uL   Hemoglobin 10.4 (L) 13.0 - 18.0 g/dL   HCT 16.1 (L) 09.6 - 04.5 %   MCV 85.8 80.0 - 100.0 fL   MCH 27.7 26.0 - 34.0 pg   MCHC 32.3 32.0 - 36.0 g/dL   RDW 40.9 (H) 81.1 - 91.4 %   Platelets 233 150 - 440 K/uL  Basic metabolic panel     Status: Abnormal   Collection Time: 07/28/15  3:20 AM  Result Value Ref Range   Sodium 141 135 - 145 mmol/L   Potassium 4.6 3.5 - 5.1 mmol/L   Chloride 104 101 - 111 mmol/L   CO2 29 22 - 32 mmol/L   Glucose, Bld 79 65 - 99 mg/dL   BUN  36 (H) 6 - 20 mg/dL   Creatinine, Ser 7.82 (H) 0.61 - 1.24 mg/dL   Calcium 8.1 (L) 8.9 - 10.3 mg/dL   GFR calc non Af  Amer 30 (L) >60 mL/min   GFR calc Af Amer 35 (L) >60 mL/min   Anion gap 8 5 - 15  Valproic acid level     Status: None   Collection Time: 07/28/15  3:20 AM  Result Value Ref Range   Valproic Acid Lvl 66 50.0 - 100.0 ug/mL  Magnesium     Status: Abnormal   Collection Time: 07/28/15  3:20 AM  Result Value Ref Range   Magnesium 2.5 (H) 1.7 - 2.4 mg/dL  Phosphorus     Status: None   Collection Time: 07/28/15  3:20 AM  Result Value Ref Range   Phosphorus 4.0 2.5 - 4.6 mg/dL  Glucose, capillary     Status: Abnormal   Collection Time: 07/28/15  4:46 AM  Result Value Ref Range   Glucose-Capillary 57 (L) 65 - 99 mg/dL  Glucose, capillary     Status: Abnormal   Collection Time: 07/28/15  5:51 AM  Result Value Ref Range   Glucose-Capillary 107 (H) 65 - 99 mg/dL  Glucose, capillary     Status: Abnormal   Collection Time: 07/28/15  7:41 AM  Result Value Ref Range   Glucose-Capillary 117 (H) 65 - 99 mg/dL  C difficile quick scan w PCR reflex     Status: None   Collection Time: 07/28/15  8:17 AM  Result Value Ref Range   C Diff antigen NEGATIVE NEGATIVE   C Diff toxin NEGATIVE NEGATIVE   C Diff interpretation Negative for C. difficile   Glucose, capillary     Status: Abnormal   Collection Time: 07/28/15 12:15 PM  Result Value Ref Range   Glucose-Capillary 176 (H) 65 - 99 mg/dL  Troponin I     Status: None   Collection Time: 07/28/15  3:21 PM  Result Value Ref Range   Troponin I <0.03 <0.031 ng/mL  Glucose, capillary     Status: Abnormal   Collection Time: 07/28/15  3:58 PM  Result Value Ref Range   Glucose-Capillary 118 (H) 65 - 99 mg/dL   Dg Chest 1 View  12/28/1094   CLINICAL DATA:  Shortness of Breath  EXAM: CHEST  1 VIEW  COMPARISON:  July 27, 2015  FINDINGS: There is no edema or consolidation. The heart size and pulmonary vascularity are normal. No adenopathy.  There is arthropathy in each shoulder.  IMPRESSION: No edema or consolidation.   Electronically Signed   By: Bretta Bang III M.D.   On: 07/28/2015 07:25   Dg Chest Port 1 View  07/27/2015   CLINICAL DATA:  Respiratory failure, status post CVA, history of COPD and previous tobacco use.  EXAM: PORTABLE CHEST - 1 VIEW  COMPARISON:  Portable chest x-ray of July 26, 2015  FINDINGS: The lungs are slightly less well inflated today following extubation of the trachea. The interstitial markings are more conspicuous bilaterally. There is no alveolar infiltrate or pleural effusion. The heart is top-normal in size. The pulmonary vascularity is not clearly engorged. The mediastinum is normal in width. There is degenerative change of the left shoulder. No acute bony thoracic abnormality is observed.  IMPRESSION: Mild bilateral hypoinflation. Coarse interstitial markings may reflect mild interstitial edema. There is no pneumonia.   Electronically Signed   By: David  [place] M.D.   On: 07/27/2015 07:24     ASSESSMENT AND PLAN: Abnormal EKG with ST elevation in lead 3 only and occasional bigeminy and trigeminy on monitor. Patient has extensive history of CVA with inability to give any history  but appears to be comfortable. Advise getting echocardiogram to look at wall motion and ejection fraction. At this time I advise getting 3 sets of cardiac enzymes to rule out MI.  Shanon Seawright A

## 2015-07-28 NOTE — Progress Notes (Addendum)
PHARMACY - CRITICAL CARE PROGRESS NOTE  Pharmacy Consult for Constipation, Electrolytes, Valproic acid dosing Indication: ICU Status   No Known Allergies  Patient Measurements: Height: 5\' 10"  (177.8 cm) Weight: 136 lb 7.4 oz (61.9 kg) IBW/kg (Calculated) : 73  Vital Signs: BP: 148/82 mmHg (08/05 0800) Pulse Rate: 73 (08/05 0800) Intake/Output from previous day: 08/04 0701 - 08/05 0700 In: 3381.5 [I.V.:1600; NG/GT:1241.5; IV Piggyback:200] Out: 2475 [Urine:2475] Intake/Output from this shift: Total I/O In: 203 [I.V.:75; Other:50; NG/GT:78] Out: -  Vent settings for last 24 hours:    Labs:  Recent Labs  07/26/15 0050 07/27/15 0727 07/28/15 0320  WBC 13.9* 13.1* 9.1  HGB 10.2* 10.6* 10.4*  HCT 31.3* 33.6* 32.2*  PLT 223 217 233  CREATININE 2.44* 2.43* 2.23*  ALBUMIN 1.9*  --   --   PROT 6.2*  --   --   AST 22  --   --   ALT 12*  --   --   ALKPHOS 85  --   --   BILITOT <0.1*  --   --    Estimated Creatinine Clearance: 30.1 mL/min (by C-G formula based on Cr of 2.23).   Recent Labs  07/28/15 0020 07/28/15 0446 07/28/15 0551  GLUCAP 118* 57* 107*    Microbiology: Recent Results (from the past 720 hour(s))  Culture, blood (routine x 2)     Status: None (Preliminary result)   Collection Time: 07/26/15 12:50 AM  Result Value Ref Range Status   Specimen Description BLOOD RIGHT ASSIST CONTROL  Final   Special Requests BOTTLES DRAWN AEROBIC AND ANAEROBIC 5CC  Final   Culture NO GROWTH 2 DAYS  Final   Report Status PENDING  Incomplete  Culture, blood (routine x 2)     Status: None (Preliminary result)   Collection Time: 07/26/15  2:22 AM  Result Value Ref Range Status   Specimen Description BLOOD BLOOD RIGHT FOREARM  Final   Special Requests   Final    BOTTLES DRAWN AEROBIC AND ANAEROBIC 1CCANAEROBIC, 7CCAEROBIC   Culture NO GROWTH 2 DAYS  Final   Report Status PENDING  Incomplete  MRSA PCR Screening     Status: Abnormal   Collection Time: 07/26/15  5:54  AM  Result Value Ref Range Status   MRSA by PCR POSITIVE (A) NEGATIVE Final    Comment:        The GeneXpert MRSA Assay (FDA approved for NASAL specimens only), is one component of a comprehensive MRSA colonization surveillance program. It is not intended to diagnose MRSA infection nor to guide or monitor treatment for MRSA infections. CRITICAL RESULT CALLED TO, READ BACK BY AND VERIFIED WITH: Loistine Simas LINDSEY 07/26/15 0840 BOD   Urine culture     Status: None (Preliminary result)   Collection Time: 07/26/15  8:00 AM  Result Value Ref Range Status   Specimen Description URINE, RANDOM  Final   Special Requests Normal  Final   Culture NO GROWTH < 24 HOURS  Final   Report Status PENDING  Incomplete    Medications:  Scheduled:  . antiseptic oral rinse  7 mL Mouth Rinse BID  . atorvastatin  40 mg Per Tube Daily  . Chlorhexidine Gluconate Cloth  6 each Topical Q0600  . cholecalciferol  2,000 Units Oral Daily  . clopidogrel  75 mg Per Tube Daily  . heparin  5,000 Units Subcutaneous 3 times per day  . insulin aspart  0-9 Units Subcutaneous 6 times per day  . ipratropium-albuterol  3  mL Nebulization 3 times per day  . latanoprost  1 drop Left Eye QHS  . levETIRAcetam  1,000 mg Per Tube BID  . levothyroxine  150 mcg Per Tube QAC breakfast  . metoprolol tartrate  25 mg Per Tube BID  . mupirocin ointment  1 application Nasal BID  . polyethylene glycol  17 g Oral Daily  . senna-docusate  1 tablet Oral BID  . Valproic Acid  750 mg Per Tube q morning - 10a   And  . Valproic Acid  1,000 mg Per Tube QHS   Infusions:  . dextrose 75 mL/hr at 07/27/15 2358  . feeding supplement (JEVITY 1.5 CAL/FIBER) 1,000 mL (07/28/15 0824)  . free water     PRN: acetaminophen, bisacodyl, labetalol, ondansetron **OR** ondansetron (ZOFRAN) IV, phenylephrine-shark liver oil-mineral oil-petrolatum  Assessment: Patient is a 64 yo male admitted from NH admitted to ICU on 8/3.  Patient was extubated on 8/4.   Pharmacy consulted to initiate constipation protocol, electrolyte supplementation, and VPA dosing/monitoring.   Electrolytes WNL; Mag elevated at 2.5  Patient currently ordered C.Diff scan.  Per charting, patient with 3 BMs/24h.   Goal of Therapy:  Correction of electrolyte abnormalities Prevention of constipation Therapeutic VPA levels: 50-100 mcg/mL   Plan:  1.  Senna/docusate 1 tablet VT BID discontinued as patient with diarrhea.  2.  Electrolyes WNL.  Mag elevated. Will recheck labs in AM.  3.  VPA level within goal range.  Continue current orders for VPA 750 mg VT qam and 1 gm VT qhs.  Order follow levels as clinically indicated.  Wandalee Klang G 07/28/2015,8:23 AM

## 2015-07-28 NOTE — Consult Note (Signed)
PULMONARY / CRITICAL CARE MEDICINE   Name: [name] MRN: [id number] DOB: April 05, 1951    ADMISSION DATE:  07/26/2015 CONSULTATION DATE:  8/03  REFERRING MD :  Eliane Decree  PT PROFILE: 89 M NH resident with severe baseline debilitation due to prior strokes (hemiplegia, chonic G-tube). Admitted early 8/03 AM from NH via EMS due to AMS/unresponsiveness. During intubation, tube feedings noted in oropharynx. Treated as aspiration PNA.   SIGNIFICANT EVENTS/STUDIES:  8/03 CT head: Severe generalized atrophy and chronic microvascular ischemic disease. Remote infarctions of the right anterior frontal lobe and right posteromedial occipital lobe. No acute intracranial findings  INDWELLING DEVICES:: ETT 8/03 >> 8/03   MICRO DATA: MRSA PCR 8/03 >> POS Urine 8/03 >>   Blood 8/03 >>    ANTIMICROBIALS:  Vanc 8/03 >> 8/04 Pip-tazo 8/03 >> 8/05    SUBJECTIVE:  RASS 0. + F/C intermittently. Strong cough with some encouragement and effort   VITAL SIGNS: Temp:  [97.8 F (36.6 C)-98.9 F (37.2 C)] 97.8 F (36.6 C) (08/04 1929) Pulse Rate:  [62-103] 69 (08/05 0700) Resp:  [11-29] 13 (08/05 0700) BP: (82-157)/(60-112) 131/104 mmHg (08/05 0700) SpO2:  [90 %-100 %] 98 % (08/05 0700) Weight:  [61.9 kg (136 lb 7.4 oz)] 61.9 kg (136 lb 7.4 oz) (08/05 0455) HEMODYNAMICS:   VENTILATOR SETTINGS:   INTAKE / OUTPUT:  Intake/Output Summary (Last 24 hours) at 07/28/15 0805 Last data filed at 07/28/15 0700  Gross per 24 hour  Intake 3381.5 ml  Output   2475 ml  Net  906.5 ml    PHYSICAL EXAMINATION: General: Chronically ill appearing.  Neuro: EOMI, R pupil reacts to light, L pupil cannot be assessed. L hemiplegia. Increased tone on L HEENT:  Temporal wasting, R cornea heavily opacified, poor dentition Cardiovascular: Reg, +gallop, no M Lungs: diffuse rhonchi, clears with cough Abdomen: G tube in place, abd soft, +BS Ext: warm, no edema  LABS:  CBC  Recent Labs Lab 07/26/15 0050  07/27/15 0727 07/28/15 0320  WBC 13.9* 13.1* 9.1  HGB 10.2* 10.6* 10.4*  HCT 31.3* 33.6* 32.2*  PLT 223 217 233   Coag's No results for input(s): APTT, INR in the last 168 hours. BMET  Recent Labs Lab 07/26/15 0050 07/27/15 0727 07/28/15 0320  NA 127* 142 141  K 4.5 4.3 4.6  CL 91* 106 104  CO2 26 29 29   BUN 46* 45* 36*  CREATININE 2.44* 2.43* 2.23*  GLUCOSE 310* 72 79   Electrolytes  Recent Labs Lab 07/26/15 0050 07/27/15 0727 07/28/15 0320  CALCIUM 7.6* 8.2* 8.1*   Sepsis Markers  Recent Labs Lab 07/26/15 0050 07/27/15 0727  LATICACIDVEN 1.6  --   PROCALCITON  --  0.24   ABG  Recent Labs Lab 07/26/15 0335  PHART 7.53*  PCO2ART 32  PO2ART 70*   Liver Enzymes  Recent Labs Lab 07/26/15 0050  AST 22  ALT 12*  ALKPHOS 85  BILITOT <0.1*  ALBUMIN 1.9*   Cardiac Enzymes  Recent Labs Lab 07/26/15 0655 07/26/15 1045 07/26/15 1706  TROPONINI 0.04* 0.03 0.03   Glucose  Recent Labs Lab 07/27/15 1151 07/27/15 1603 07/27/15 1942 07/28/15 0020 07/28/15 0446 07/28/15 0551  GLUCAP 105* 158* 127* 118* 57* 107*    CXR: NNF. CXR 8/04 without edema or infiltrate    ASSESSMENT / PLAN:  PULMONARY A: Vent dependent respiratory failure, resolved  Mucus retention P:   Supp O2 as needed to maintain SpO2 > 92? Cont nebulized bronchodilators Work on  airway hygiene - chest percussion, encourage cough, PRN NTS  CARDIOVASCULAR A:  No acute issues P:  Cont to monitor  RENAL A:   AKI, nonoliguric - Cr improved this AM CRI ( baseline Cr 1.7 - 1.9) Very poor candidate for HD of any duration P:   Monitor BMET intermittently Monitor I/Os Correct electrolytes as indicated  GASTROINTESTINAL A:   Chronic dysphagia Chronic G-tube status P:   SUP: N/I post extubation Cont TFs with HOB elevation  HEMATOLOGIC A:   No issues P:  DVT px: SQ heparin Monitor CBC intermittently Transfuse per usual guidelines  INFECTIOUS A:   Doubt  PNA (CXR clear, PCT minimally elevated) MRSA colonization P:   Monitor temp, WBC count Micro and abx as above Cont contact precautions for MRSA  ENDOCRINE A:  Hyperglycemia without prior dx of DM - resolved  A1C elevated - likely has DM 2 Episodic hypoglycemia Hypothyroidism - appropriately repleted P:   SSI changed to sens scale 8/05 Cont levothyroxine per tube at current dose  NEUROLOGIC A:   Obtundation, improving Prior CVAs Seizure disorder Severe chronic debilitation P:   RASS goal: 0 Cont anticonvulsants per home regimen Minimize sedatives/opioids Palliative Care evaluating   I continue to believe that he is a poor candidate for ACLS, re-intubation and hemodialysis based on his severe debilitation @ baseline    Billy Fischer, MD  [place] [phone].  Pulmonary and Critical Care Medicine   07/28/2015, 8:05 AM

## 2015-07-28 NOTE — Care Management Note (Signed)
Case Management Note  Patient Details  Name: [name] MRN: [id number] Date of Birth: [birth year]  Subjective/Objective:                  Met with patient's family briefly prior to progression rounds this morning. Patient is long term care patient at [facility]. Patient is bed bound at baseline and family states he will not be able to do therapy. He is not on O2 currently. Palliative following.   Action/Plan: Mel Almond CSW updated. No current RNCM needs.   Expected Discharge Date:                  Expected Discharge Plan:     In-House Referral:     Discharge planning Services     Post Acute Care Choice:    Choice offered to:  Sibling  DME Arranged:  N/A DME Agency:  NA  HH Arranged:  NA HH Agency:  NA  Status of Service:  Completed, signed off  Medicare Important Message Given:    Date Medicare IM Given:    Medicare IM give by:    Date Additional Medicare IM Given:    Additional Medicare Important Message give by:     If discussed at [place] of Stay Meetings, dates discussed:    Additional Comments:  Marshell Garfinkel, RN 07/28/2015, 10:44 AM

## 2015-07-28 NOTE — Progress Notes (Signed)
Dr. Allena Katz aware that patient's EKG shows ST elevation in the 3 lead and that compared to previous EKG the new EKG shows anterior infarct.  MD aware that both new and previous EKG show ST elevation in 3 lead and inferior infarct.  Patient denies chest pain.  Dr. Allena Katz stated "Im going to order a cardiology consult and cardiac enzymes and the patient can still go to the floor with tele."  Random Dobrowski B

## 2015-07-28 NOTE — Progress Notes (Signed)
Notified Dr. Yehuda Mao of pts hypertension, he will place order.

## 2015-07-29 LAB — GLUCOSE, CAPILLARY
Glucose-Capillary: 107 mg/dL — ABNORMAL HIGH (ref 65–99)
Glucose-Capillary: 145 mg/dL — ABNORMAL HIGH (ref 65–99)
Glucose-Capillary: 152 mg/dL — ABNORMAL HIGH (ref 65–99)
Glucose-Capillary: 187 mg/dL — ABNORMAL HIGH (ref 65–99)
Glucose-Capillary: 199 mg/dL — ABNORMAL HIGH (ref 65–99)
Glucose-Capillary: 72 mg/dL (ref 65–99)
Glucose-Capillary: 75 mg/dL (ref 65–99)
Glucose-Capillary: 94 mg/dL (ref 65–99)

## 2015-07-29 LAB — CBC
HEMATOCRIT: 36.3 % — AB (ref 40.0–52.0)
HEMOGLOBIN: 11.8 g/dL — AB (ref 13.0–18.0)
MCH: 27.9 pg (ref 26.0–34.0)
MCHC: 32.5 g/dL (ref 32.0–36.0)
MCV: 85.7 fL (ref 80.0–100.0)
PLATELETS: 252 10*3/uL (ref 150–440)
RBC: 4.24 MIL/uL — ABNORMAL LOW (ref 4.40–5.90)
RDW: 16.3 % — ABNORMAL HIGH (ref 11.5–14.5)
WBC: 8 10*3/uL (ref 3.8–10.6)

## 2015-07-29 LAB — BASIC METABOLIC PANEL
ANION GAP: 7 (ref 5–15)
BUN: 32 mg/dL — ABNORMAL HIGH (ref 6–20)
CHLORIDE: 105 mmol/L (ref 101–111)
CO2: 29 mmol/L (ref 22–32)
Calcium: 8.3 mg/dL — ABNORMAL LOW (ref 8.9–10.3)
Creatinine, Ser: 1.92 mg/dL — ABNORMAL HIGH (ref 0.61–1.24)
GFR calc Af Amer: 41 mL/min — ABNORMAL LOW (ref >60)
GFR, EST NON AFRICAN AMERICAN: 36 mL/min — AB (ref >60)
Glucose, Bld: 86 mg/dL (ref 65–99)
Potassium: 4.7 mmol/L (ref 3.5–5.1)
Sodium: 141 mmol/L (ref 135–145)

## 2015-07-29 LAB — TROPONIN I

## 2015-07-29 LAB — MAGNESIUM: Magnesium: 2.4 mg/dL (ref 1.7–2.4)

## 2015-07-29 LAB — PHOSPHORUS: PHOSPHORUS: 3.8 mg/dL (ref 2.5–4.6)

## 2015-07-29 MED ORDER — SODIUM CHLORIDE 0.9 % IJ SOLN
3.0000 mL | Freq: Two times a day (BID) | INTRAMUSCULAR | Status: DC
  Administered 2015-07-29 – 2015-08-01 (×7): 3 mL via INTRAVENOUS

## 2015-07-29 MED ORDER — SODIUM CHLORIDE 0.9 % IJ SOLN
3.0000 mL | INTRAMUSCULAR | Status: DC | PRN
  Administered 2015-07-29 – 2015-07-30 (×3): 3 mL via INTRAVENOUS
  Filled 2015-07-29 (×2): qty 10

## 2015-07-29 NOTE — Progress Notes (Addendum)
Per MD patient will likely be ready for D/C tomorrow 07/30/15. Clinical Child psychotherapist (CSW) contacted [employer] at [facility] and made her aware of above. Per Gavin Pound patient can return tomorrow. CSW attempted to contact patient's daughter and sister and left voicemail's. FL2 updated and on chart. CSW will continue to follow and assist as needed.   Patient's daughter Hughes Better called CSW back and reported that she is agreeable for patient to return to [facility] tomorrow.   Jetta Lout, LCSWA [phone]

## 2015-07-29 NOTE — Progress Notes (Signed)
Pt had a witnessed seizure by lab staff and charge nurse. Pt alert with eyes open but baseline is nonverbal. No other concerns. Behavior and actions at baseline.  Having difficulty keeping head elevated, needs frequent repositioning.

## 2015-07-29 NOTE — Progress Notes (Signed)
Patient ID: [name], male   DOB: 1951-01-07, 64 y.o.   MRN: [id number] [facility] PROGRESS NOTE  PCP: Manus Rudd LEE, DO  HPI/Subjective: No cough or shortness of breath. Audible rhonchi heard from upper airway.   Objective: Filed Vitals:   07/29/15 0909  BP: 141/63  Pulse: 97  Temp: 98.5 F (36.9 C)  Resp: 20    Filed Weights   07/26/15 0600 07/27/15 0500 07/28/15 0455  Weight: 58.7 kg (129 lb 6.6 oz) 63.9 kg (140 lb 14 oz) 61.9 kg (136 lb 7.4 oz)    ROS: Review of Systems  Unable to perform ROS  patient stated no coughing or shortness of breath. Did not answer some of the other questions. Exam: Physical Exam  HENT:  Nose: No mucosal edema.  Mouth/Throat: No oropharyngeal exudate or posterior oropharyngeal edema.  Eyes: Lids are normal.  Right eye clouded over. Left eye reactive to light  Neck: No JVD present. Carotid bruit is not present. No edema present. No thyroid mass and no thyromegaly present.  Cardiovascular: Regular rhythm, S1 normal and S2 normal.  Exam reveals no gallop.   No murmur heard. Pulses:      Dorsalis pedis pulses are 1+ on the right side, and 1+ on the left side.  Respiratory: No respiratory distress. He has no wheezes. He has no rhonchi. He has no rales.  Upper airway congestion.  GI: Soft. Bowel sounds are normal. There is no tenderness.  Abdomen wet with tube feeds. Connection little bit loose.  Musculoskeletal:       Right ankle: He exhibits no swelling.       Left ankle: He exhibits no swelling.  Lymphadenopathy:    He has no cervical adenopathy.  Neurological: He is alert.  Turns his head on his own. Arms contracted  Skin: Skin is warm. No rash noted. Nails show no clubbing.  Hands and feet cold.  Psychiatric: His speech is slurred.    Data Reviewed: Basic Metabolic Panel:  Recent Labs Lab 07/26/15 0050 07/27/15 0727 07/28/15 0320 07/29/15 0748  NA 127* 142 141 141  K 4.5 4.3 4.6 4.7  CL 91* 106  104 105  CO2 26 29 29 29   GLUCOSE 310* 72 79 86  BUN 46* 45* 36* 32*  CREATININE 2.44* 2.43* 2.23* 1.92*  CALCIUM 7.6* 8.2* 8.1* 8.3*  MG  --   --  2.5* 2.4  PHOS  --   --  4.0 3.8   Liver Function Tests:  Recent Labs Lab 07/26/15 0050  AST 22  ALT 12*  ALKPHOS 85  BILITOT <0.1*  PROT 6.2*  ALBUMIN 1.9*   CBC:  Recent Labs Lab 07/26/15 0050 07/27/15 0727 07/28/15 0320 07/29/15 0748  WBC 13.9* 13.1* 9.1 8.0  HGB 10.2* 10.6* 10.4* 11.8*  HCT 31.3* 33.6* 32.2* 36.3*  MCV 84.1 85.7 85.8 85.7  PLT 223 217 233 252      Recent Results (from the past 240 hour(s))  Culture, blood (routine x 2)     Status: None (Preliminary result)   Collection Time: 07/26/15 12:50 AM  Result Value Ref Range Status   Specimen Description BLOOD RIGHT ASSIST CONTROL  Final   Special Requests BOTTLES DRAWN AEROBIC AND ANAEROBIC 5CC  Final   Culture NO GROWTH 3 DAYS  Final   Report Status PENDING  Incomplete  Culture, blood (routine x 2)     Status: None (Preliminary result)   Collection Time: 07/26/15  2:22 AM  Result  Value Ref Range Status   Specimen Description BLOOD BLOOD RIGHT FOREARM  Final   Special Requests   Final    BOTTLES DRAWN AEROBIC AND ANAEROBIC 1CCANAEROBIC, 7CCAEROBIC   Culture NO GROWTH 3 DAYS  Final   Report Status PENDING  Incomplete  MRSA PCR Screening     Status: Abnormal   Collection Time: 07/26/15  5:54 AM  Result Value Ref Range Status   MRSA by PCR POSITIVE (A) NEGATIVE Final    Comment:        The GeneXpert MRSA Assay (FDA approved for NASAL specimens only), is one component of a comprehensive MRSA colonization surveillance program. It is not intended to diagnose MRSA infection nor to guide or monitor treatment for MRSA infections. CRITICAL RESULT CALLED TO, READ BACK BY AND VERIFIED WITH: JOHANNA LINDSEY 07/26/15 0840 BOD   Urine culture     Status: None   Collection Time: 07/26/15  8:00 AM  Result Value Ref Range Status   Specimen Description  URINE, RANDOM  Final   Special Requests Normal  Final   Culture INSIGNIFICANT GROWTH  Final   Report Status 07/28/2015 FINAL  Final  C difficile quick scan w PCR reflex     Status: None   Collection Time: 07/28/15  8:17 AM  Result Value Ref Range Status   C Diff antigen NEGATIVE NEGATIVE Final   C Diff toxin NEGATIVE NEGATIVE Final   C Diff interpretation Negative for C. difficile  Final     Studies: Dg Chest 1 View  07/28/2015   CLINICAL DATA:  Shortness of Breath  EXAM: CHEST  1 VIEW  COMPARISON:  July 27, 2015  FINDINGS: There is no edema or consolidation. The heart size and pulmonary vascularity are normal. No adenopathy. There is arthropathy in each shoulder.  IMPRESSION: No edema or consolidation.   Electronically Signed   By: Bretta Bang III M.D.   On: 07/28/2015 07:25    Scheduled Meds: . antiseptic oral rinse  7 mL Mouth Rinse BID  . atorvastatin  40 mg Per Tube Daily  . Chlorhexidine Gluconate Cloth  6 each Topical Q0600  . cholecalciferol  2,000 Units Oral Daily  . clopidogrel  75 mg Per Tube Daily  . heparin  5,000 Units Subcutaneous 3 times per day  . insulin aspart  0-9 Units Subcutaneous 6 times per day  . ipratropium-albuterol  3 mL Nebulization 3 times per day  . latanoprost  1 drop Left Eye QHS  . levETIRAcetam  1,000 mg Per Tube BID  . levothyroxine  150 mcg Per Tube QAC breakfast  . metoprolol tartrate  25 mg Per Tube BID  . mupirocin ointment  1 application Nasal BID  . scopolamine  1 patch Transdermal Q72H  . sodium chloride  3 mL Intravenous Q12H  . Valproic Acid  750 mg Per Tube q morning - 10a   And  . Valproic Acid  1,000 mg Per Tube QHS   Continuous Infusions: . feeding supplement (JEVITY 1.5 CAL/FIBER)    . free water      Assessment/Plan:  1. Acute respiratory failure with hypoxia. Patient was intubated and then extubated the next day. Currently breathing comfortably on room air this has resolved. 2. Acute renal failure on chronic kidney  disease stage III. Hydration just with PEG. Check BMP tomorrow. 3. Suspected sepsis. 2 repeated chest x-rays are negative for pneumonia and antibiotics were stopped. 4. History of seizure on Keppra and valproic acid 5. History of stroke- on  Plavix 6. Hypothyroidism unspecified on levothyroxin 7. Essential hypertension- continue metoprolol 8. Malnutrition- patient with a very low albumen. I spoke with dietitian. He hopefully will be at goal rate for tube feeding later on today. She will calculate how much she needs for hydration.  Code Status:     Code Status Orders        Start     Ordered   07/26/15 0506  Full code   Continuous     07/26/15 0505     Family Communication: Family members at the bedside Disposition Plan: Back to [facility] tomorrow  Time spent: 25 minutes  Alford Highland  [facility]

## 2015-07-29 NOTE — Progress Notes (Signed)
SUBJECTIVE: Patient is unable to give any history   Filed Vitals:   07/28/15 2030 07/28/15 2129 07/29/15 0748 07/29/15 0909  BP:  128/57  141/63  Pulse:  76  97  Temp:  98.5 F (36.9 C)  98.5 F (36.9 C)  TempSrc:  Oral    Resp:    20  Height:      Weight:      SpO2: 97% 100% 96% 99%    Intake/Output Summary (Last 24 hours) at 07/29/15 1228 Last data filed at 07/29/15 0901  Gross per 24 hour  Intake   3655 ml  Output    950 ml  Net   2705 ml    LABS: Basic Metabolic Panel:  Recent Labs  57/84/69 0320 07/29/15 0748  NA 141 141  K 4.6 4.7  CL 104 105  CO2 29 29  GLUCOSE 79 86  BUN 36* 32*  CREATININE 2.23* 1.92*  CALCIUM 8.1* 8.3*  MG 2.5* 2.4  PHOS 4.0 3.8   Liver Function Tests: No results for input(s): AST, ALT, ALKPHOS, BILITOT, PROT, ALBUMIN in the last 72 hours. No results for input(s): LIPASE, AMYLASE in the last 72 hours. CBC:  Recent Labs  07/28/15 0320 07/29/15 0748  WBC 9.1 8.0  HGB 10.4* 11.8*  HCT 32.2* 36.3*  MCV 85.8 85.7  PLT 233 252   Cardiac Enzymes:  Recent Labs  07/28/15 1521 07/28/15 2317 07/29/15 0748  TROPONINI <0.03 <0.03 <0.03   BNP: Invalid input(s): POCBNP D-Dimer: No results for input(s): DDIMER in the last 72 hours. Hemoglobin A1C:  Recent Labs  07/27/15 0727  HGBA1C 6.9*   Fasting Lipid Panel: No results for input(s): CHOL, HDL, LDLCALC, TRIG, CHOLHDL, LDLDIRECT in the last 72 hours. Thyroid Function Tests:  Recent Labs  07/27/15 0727  TSH 1.291   Anemia Panel: No results for input(s): VITAMINB12, FOLATE, FERRITIN, TIBC, IRON, RETICCTPCT in the last 72 hours.   PHYSICAL EXAM General: Well developed, well nourished, in no acute distress HEENT:  Normocephalic and atramatic Neck:  No JVD.  Lungs: Clear bilaterally to auscultation and percussion. Heart: HRRR . Normal S1 and S2 without gallops or murmurs.  Abdomen: Bowel sounds are positive, abdomen soft and non-tender  Msk:  Back normal, normal  gait. Normal strength and tone for age. Extremities: No clubbing, cyanosis or edema.   Neuro: Alert and oriented X 3. Psych:  Good affect, responds appropriately  TELEMETRY: Sinus rhythm  ASSESSMENT AND PLAN: Patient has occasional trigeminy and nonspecific ST-T changes on EKG with wall motion abnormality suggestive of coronary artery disease. LVEF was 45%. Due to stroke and altered mental status, is not a candidate for revascularization.  Active Problems:   Acute respiratory failure   Pressure ulcer    Aalyssa Elderkin A, MD, [facility] 07/29/2015 12:28 PM

## 2015-07-29 NOTE — Progress Notes (Signed)
Pt observed to scoot self down in bed with legs over bed edge. Pt repositioned multiple times, but pt scoots and moves self back down in bed increasing his risk for aspiration.

## 2015-07-29 NOTE — Plan of Care (Signed)
Problem: Discharge Progression Outcomes Goal: Other Discharge Outcomes/Goals Outcome: Progressing 1. Plans for return to [facility] with tube feedings upon discharge.. 2. No signs of pain observed this shift. 3. VSS. Labs stable. 4. Required oral suction with relief of upper air congestion which pt tolerated well. Gtube disconnected with some feeding lost with Dr. Hilton Sinclair advised of loose connection. Tape applied and checked with nursing rounds. Jevity feedings tolerated with no residuals; pt is experiencing some loose stools with c diff negative. Skin excoriation around perianal area cleansed with barrier and pink foam dsg changed. 5. Bedrest continued with every 2 hour repositioning. HOB keep 30 degrees or higher. Family at bedside and involved in pt care needs.

## 2015-07-30 ENCOUNTER — Inpatient Hospital Stay: Payer: Medicare Other

## 2015-07-30 LAB — GLUCOSE, CAPILLARY
Glucose-Capillary: 101 mg/dL — ABNORMAL HIGH (ref 65–99)
Glucose-Capillary: 113 mg/dL — ABNORMAL HIGH (ref 65–99)
Glucose-Capillary: 127 mg/dL — ABNORMAL HIGH (ref 65–99)
Glucose-Capillary: 130 mg/dL — ABNORMAL HIGH (ref 65–99)
Glucose-Capillary: 133 mg/dL — ABNORMAL HIGH (ref 65–99)
Glucose-Capillary: 134 mg/dL — ABNORMAL HIGH (ref 65–99)
Glucose-Capillary: 140 mg/dL — ABNORMAL HIGH (ref 65–99)
Glucose-Capillary: 140 mg/dL — ABNORMAL HIGH (ref 65–99)
Glucose-Capillary: 171 mg/dL — ABNORMAL HIGH (ref 65–99)
Glucose-Capillary: 178 mg/dL — ABNORMAL HIGH (ref 65–99)
Glucose-Capillary: 36 mg/dL — CL (ref 65–99)
Glucose-Capillary: 49 mg/dL — ABNORMAL LOW (ref 65–99)
Glucose-Capillary: 96 mg/dL (ref 65–99)

## 2015-07-30 MED ORDER — DEXTROSE 50 % IV SOLN
1.0000 | INTRAVENOUS | Status: AC
Start: 2015-07-30 — End: 2015-07-30
  Administered 2015-07-30: 05:00:00 50 mL via INTRAVENOUS
  Filled 2015-07-30: qty 50

## 2015-07-30 MED ORDER — CLINDAMYCIN PHOSPHATE 600 MG/50ML IV SOLN
600.0000 mg | Freq: Three times a day (TID) | INTRAVENOUS | Status: DC
  Administered 2015-07-30 – 2015-08-01 (×7): 600 mg via INTRAVENOUS
  Filled 2015-07-30 (×12): qty 50

## 2015-07-30 NOTE — Progress Notes (Signed)
PT NOTED WITH FSBS OF 39, PT PULLED TUBE FEEDS LOOSE AND PULLED OUT BOTH IV'S, UNABLE TO GET IV AFTER SEVERAL STICKS, SUPERVISOR INTO HELP, APPLE JUICE X 1 GIVEN VIA G TUBE TO GET FSBS UP, DR. CHEN NOTIFIED, IV SITES OBTAINED, IV D50 1 AMP GIVEN PER ORDERS, FSBS 96 AFTER D50.  PT NOTED WITH INCREASED RESP CONGESTION, VSS, DR. CHEN NOTIFIED, ORDERED PCXR.  WILL MONITOR Casimer Leek, RN 07/30/2015 5:33 AM

## 2015-07-30 NOTE — Progress Notes (Signed)
NTS for large amounts thick white sec.

## 2015-07-30 NOTE — Plan of Care (Signed)
Problem: Discharge Progression Outcomes Goal: Discharge plan in place and appropriate Individualization of Care Pt from [facility] Hx of CVA, COPD, HEMIPHEGIA, DYSPHAGIA, G TUBE, MUSCLE WEAKNESS, UTI, HYPERKALEMIA, DEMENTIA HIGH FALL PRECAUTIONS Goal: Other Discharge Outcomes/Goals Plan of Care Progress to Goal:  PT TOLERATING GTUBE FEEDS WELL, PT ON ASPIRATION PRECAUTIONS, YANKAR SUCTION PRN FOR MOUTH SECRETIONS. TURNED Q 2 HOURS

## 2015-07-30 NOTE — Progress Notes (Signed)
SUBJECTIVE: Patient unable to give any history because he's not ORIENTED. Patient's been like that since he was admitted and even after extubation. He had a prior stroke   Filed Vitals:   07/30/15 0500 07/30/15 0528 07/30/15 0732 07/30/15 0813  BP:  154/97  159/81  Pulse:  89  91  Temp:    97.8 F (36.6 C)  TempSrc:    Oral  Resp:  28    Height:      Weight: 60.555 kg (133 lb 8 oz)     SpO2:  98% 98% 94%    Intake/Output Summary (Last 24 hours) at 07/30/15 1247 Last data filed at 07/30/15 1021  Gross per 24 hour  Intake    666 ml  Output      0 ml  Net    666 ml    LABS: Basic Metabolic Panel:  Recent Labs  16/10/96 0320 07/29/15 0748  NA 141 141  K 4.6 4.7  CL 104 105  CO2 29 29  GLUCOSE 79 86  BUN 36* 32*  CREATININE 2.23* 1.92*  CALCIUM 8.1* 8.3*  MG 2.5* 2.4  PHOS 4.0 3.8   Liver Function Tests: No results for input(s): AST, ALT, ALKPHOS, BILITOT, PROT, ALBUMIN in the last 72 hours. No results for input(s): LIPASE, AMYLASE in the last 72 hours. CBC:  Recent Labs  07/28/15 0320 07/29/15 0748  WBC 9.1 8.0  HGB 10.4* 11.8*  HCT 32.2* 36.3*  MCV 85.8 85.7  PLT 233 252   Cardiac Enzymes:  Recent Labs  07/28/15 1521 07/28/15 2317 07/29/15 0748  TROPONINI <0.03 <0.03 <0.03   BNP: Invalid input(s): POCBNP D-Dimer: No results for input(s): DDIMER in the last 72 hours. Hemoglobin A1C: No results for input(s): HGBA1C in the last 72 hours. Fasting Lipid Panel: No results for input(s): CHOL, HDL, LDLCALC, TRIG, CHOLHDL, LDLDIRECT in the last 72 hours. Thyroid Function Tests: No results for input(s): TSH, T4TOTAL, T3FREE, THYROIDAB in the last 72 hours.  Invalid input(s): FREET3 Anemia Panel: No results for input(s): VITAMINB12, FOLATE, FERRITIN, TIBC, IRON, RETICCTPCT in the last 72 hours.   PHYSICAL EXAM General: Well developed, well nourished, in no acute distress HEENT:  Normocephalic and atramatic Neck:  No JVD.  Lungs: Clear  bilaterally to auscultation and percussion. Heart: HRRR . Normal S1 and S2 without gallops or murmurs.  Abdomen: Bowel sounds are positive, abdomen soft and non-tender  Msk:  Back normal, normal gait. Normal strength and tone for age. Extremities: No clubbing, cyanosis or edema.   Neuro: Alert and oriented X 3. Psych:  Good affect, responds appropriately  TELEMETRY: Sinus rhythm  ASSESSMENT AND PLAN: Elevated troponin/history of CVA and altered mental status. Patient is not a candidate for revascularization.  Active Problems:   Acute respiratory failure   Pressure ulcer    Ruhaan Nordahl A, MD, [facility] 07/30/2015 12:47 PM

## 2015-07-30 NOTE — Progress Notes (Signed)
Residual check 35 ml with tube feeding restarted. Oral suction removed only very scant amount. Respirations sound very congested with pt resting quietly with eyes closed. Respiratory called to assess pt for NT suction. Will continue to assess residuals and observe high risk aspiration precautions.

## 2015-07-30 NOTE — Plan of Care (Signed)
Problem: Discharge Progression Outcomes Goal: Other Discharge Outcomes/Goals Outcome: Progressing 1. Plans were to return to SNF today, but delayed due to hypoglycemia, lung congestion and gtube problem. Continued IP care required. IV antibiotics started. 2. Rest quietly with eyes closed unless being care for or suctioned. Pt becomes agitated to touch. No signs pain at rest. 3. VSS. No acute distress. 4. Increased lung congestion with wet congested lung respiratory sounds. Oral suction done with minimal benefit. Respiratory has done NT suction with large amounts of white sputum removed.Clindamycin IV started. 5. Peg tube end replaced with tight connection achieved between feed tubing and peg tube. Pt did dislodge plug for side port on feeding tube once but was quickly resolved. Pt rubs hands back and forth for tube area. Pillow placed on top of peg tube to protect it. FSBS's increased to every 2 hours/stable. Tolerated tube feedings until 1600 check with feeding held and restarted with residual of 35 ml. Will closely assess residuals and feeding tolerance.

## 2015-07-30 NOTE — Progress Notes (Signed)
Patient ID: [name], male   DOB: [birth year], 64 y.o.   MRN: [id number]  [facility] Physicians PROGRESS NOTE  PCP: Manus Rudd LEE, DO  HPI/Subjective: Patient with audible rhonchi heard even after suctioning this morning. As per nurse had a rough night. He was scooting down in the bed (so he is a higher risk for aspiration while lying flat). Increase respiratory rate noted. Chest x-ray done last night showed possible infiltrate. Also had a low sugar this morning. The connection between the tube feeding and the PEG tube is loose and needed to be taped. It did come apart this morning which may be contributing to the low sugar. Patient not able to give much history today.  Objective: Filed Vitals:   07/30/15 0813  BP: 159/81  Pulse: 91  Temp: 97.8 F (36.6 C)  Resp:     Filed Weights   07/27/15 0500 07/28/15 0455 07/30/15 0500  Weight: 63.9 kg (140 lb 14 oz) 61.9 kg (136 lb 7.4 oz) 60.555 kg (133 lb 8 oz)    ROS: Review of Systems  Unable to perform ROS  secondary to mental status. Exam: Physical Exam  HENT:  Nose: No mucosal edema.  Mouth/Throat: No oropharyngeal exudate or posterior oropharyngeal edema.  Eyes: Lids are normal.  Right eye clouded over. Left eye reactive to light  Neck: No JVD present. Carotid bruit is not present. No edema present. No thyroid mass and no thyromegaly present.  Cardiovascular: Regular rhythm, S1 normal and S2 normal.  Exam reveals no gallop.   No murmur heard. Pulses:      Dorsalis pedis pulses are 1+ on the right side, and 1+ on the left side.  Respiratory: No respiratory distress. He has no wheezes. He has rhonchi in the right middle field, the right lower field, the left middle field and the left lower field. He has no rales.  Upper airway congestion.  GI: Soft. Bowel sounds are normal. There is no tenderness.  Abdomen wet with tube feeds. Connection little bit loose.  Musculoskeletal:       Right ankle: He exhibits no  swelling.       Left ankle: He exhibits no swelling.  Lymphadenopathy:    He has no cervical adenopathy.  Neurological: He is alert.  Turns his head on his own. Arms contracted  Skin: Skin is warm. No rash noted. Nails show no clubbing.  Hands and feet cold.  Psychiatric: His speech is slurred.    Data Reviewed: Basic Metabolic Panel:  Recent Labs Lab 07/26/15 0050 07/27/15 0727 07/28/15 0320 07/29/15 0748  NA 127* 142 141 141  K 4.5 4.3 4.6 4.7  CL 91* 106 104 105  CO2 GLUCOSE 310* 72 79 86  BUN 46* 45* 36* 32*  CREATININE 2.44* 2.43* 2.23* 1.92*  CALCIUM 7.6* 8.2* 8.1* 8.3*  MG  --   --  2.5* 2.4  PHOS  --   --  4.0 3.8   Liver Function Tests:  Recent Labs Lab 07/26/15 0050  AST 22  ALT 12*  ALKPHOS 85  BILITOT <0.1*  PROT 6.2*  ALBUMIN 1.9*   CBC:  Recent Labs Lab 07/26/15 0050 07/27/15 0727 07/28/15 0320 07/29/15 0748  WBC 13.9* 13.1* 9.1 8.0  HGB 10.2* 10.6* 10.4* 11.8*  HCT 31.3* 33.6* 32.2* 36.3*  MCV 84.1 85.7 85.8 85.7  PLT 223 217 233 252   Studies: Dg Chest Port 1 View  07/30/2015   CLINICAL DATA:  Increasing congestion.  EXAM: PORTABLE CHEST - 1 VIEW  COMPARISON:  07/28/2015  FINDINGS: Mild basilar airspace opacities are present, worsened. This may be atelectatic or infectious. No large effusion is evident.  IMPRESSION: Worsening basilar atelectasis or infiltrate.   Electronically Signed   By: Ellery Plunk M.D.   On: 07/30/2015 06:14    Scheduled Meds: . antiseptic oral rinse  7 mL Mouth Rinse BID  . atorvastatin  40 mg Per Tube Daily  . Chlorhexidine Gluconate Cloth  6 each Topical Q0600  . cholecalciferol  2,000 Units Oral Daily  . clindamycin (CLEOCIN) IV  600 mg Intravenous 3 times per day  . clopidogrel  75 mg Per Tube Daily  . heparin  5,000 Units Subcutaneous 3 times per day  . ipratropium-albuterol  3 mL Nebulization 3 times per day  . latanoprost  1 drop Left Eye QHS  . levETIRAcetam  1,000 mg Per Tube BID   . levothyroxine  150 mcg Per Tube QAC breakfast  . metoprolol tartrate  25 mg Per Tube BID  . mupirocin ointment  1 application Nasal BID  . scopolamine  1 patch Transdermal Q72H  . sodium chloride  3 mL Intravenous Q12H  . Valproic Acid  750 mg Per Tube q morning - 10a   And  . Valproic Acid  1,000 mg Per Tube QHS   Continuous Infusions: . feeding supplement (JEVITY 1.5 CAL/FIBER) 1,000 mL (07/29/15 1756)  . free water      Assessment/Plan:  1. Likely aspiration event overnight - chest x-ray possible infiltrate, increased respiratory rate, rhonchi heard in the lungs. Start IV clindamycin for aspiration pneumonia.  2. Hypoglycemia - likely related to insulin coverage given at 2356 and PEG tube feeding being disconnected secondary to loose connection. I will stop sliding scale coverage altogether. I rather have the patient's fingersticks higher than too low. Check fingersticks every 2 hours with no coverage. I will get a GI consultation for loose connection via the PEG.  3. Acute respiratory failure with hypoxia. Patient was intubated and then extubated the next day. Increased respiratory rate possible with aspiration continue to monitor. 4. Acute renal failure on chronic kidney disease stage III. Hydration just with PEG. Check BMP tomorrow. 5. History of seizure on Keppra and valproic acid 6. History of stroke- on Plavix 7. Hypothyroidism unspecified on levothyroxin 8. Essential hypertension- continue metoprolol 9. Malnutrition- patient with a very low albumen. Patient at goal rate with feeding. Dietitian would rather have continuous feeding while here.  Code Status:     Code Status Orders        Start     Ordered   07/26/15 0506  Full code   Continuous     07/26/15 0505     Family Communication: Daughter on phone. Disposition Plan: Back to [facility] when stable. Potentially another 1-2 days.  Time spent: 25 minutes  Alford Highland  [facility]  Hospitalists

## 2015-07-30 NOTE — Progress Notes (Signed)
NTS for large amounts thick white sec. 

## 2015-07-30 NOTE — Progress Notes (Signed)
Residual check 60 ml. Tube feeding place on hold. HOB greater than 30 degrees. Gtube site WNL's.  Pt continues to have rhonchi with upper airway congestion-not available to suction by yankauner. Pt resting quietly with eyes closed, muscles relaxed, skin warm/dry. Family in and updated on condition this afternoon.

## 2015-07-30 NOTE — Consult Note (Addendum)
Called to see patient about a technical problem with the  PEG.  The slip tip on the tube feed catheter would not stay in the PEG tube port.  The peg tube port was replaced with a new one, now functioning well.  Will sign off. Reconsult if needed. Time spent 20 minutes, obtaining materials and repairing tube as above.

## 2015-07-31 LAB — CULTURE, BLOOD (ROUTINE X 2)
Culture: NO GROWTH
Culture: NO GROWTH

## 2015-07-31 LAB — CBC
HCT: 35.6 % — ABNORMAL LOW (ref 40.0–52.0)
Hemoglobin: 11.6 g/dL — ABNORMAL LOW (ref 13.0–18.0)
MCH: 28.1 pg (ref 26.0–34.0)
MCHC: 32.5 g/dL (ref 32.0–36.0)
MCV: 86.4 fL (ref 80.0–100.0)
Platelets: 265 10*3/uL (ref 150–440)
RBC: 4.12 MIL/uL — ABNORMAL LOW (ref 4.40–5.90)
RDW: 15.9 % — ABNORMAL HIGH (ref 11.5–14.5)
WBC: 8.9 10*3/uL (ref 3.8–10.6)

## 2015-07-31 LAB — BASIC METABOLIC PANEL
ANION GAP: 7 (ref 5–15)
BUN: 36 mg/dL — ABNORMAL HIGH (ref 6–20)
CO2: 31 mmol/L (ref 22–32)
CREATININE: 2.2 mg/dL — AB (ref 0.61–1.24)
Calcium: 8.5 mg/dL — ABNORMAL LOW (ref 8.9–10.3)
Chloride: 107 mmol/L (ref 101–111)
GFR calc Af Amer: 35 mL/min — ABNORMAL LOW (ref >60)
GFR calc non Af Amer: 30 mL/min — ABNORMAL LOW (ref >60)
Glucose, Bld: 155 mg/dL — ABNORMAL HIGH (ref 65–99)
Potassium: 5.1 mmol/L (ref 3.5–5.1)
Sodium: 145 mmol/L (ref 135–145)

## 2015-07-31 LAB — GLUCOSE, CAPILLARY
Glucose-Capillary: 122 mg/dL — ABNORMAL HIGH (ref 65–99)
Glucose-Capillary: 128 mg/dL — ABNORMAL HIGH (ref 65–99)
Glucose-Capillary: 132 mg/dL — ABNORMAL HIGH (ref 65–99)
Glucose-Capillary: 135 mg/dL — ABNORMAL HIGH (ref 65–99)
Glucose-Capillary: 137 mg/dL — ABNORMAL HIGH (ref 65–99)
Glucose-Capillary: 143 mg/dL — ABNORMAL HIGH (ref 65–99)
Glucose-Capillary: 154 mg/dL — ABNORMAL HIGH (ref 65–99)
Glucose-Capillary: 168 mg/dL — ABNORMAL HIGH (ref 65–99)
Glucose-Capillary: 178 mg/dL — ABNORMAL HIGH (ref 65–99)
Glucose-Capillary: 178 mg/dL — ABNORMAL HIGH (ref 65–99)

## 2015-07-31 MED ORDER — BUDESONIDE 0.25 MG/2ML IN SUSP: 0.2500 mg | Freq: Two times a day (BID) | RESPIRATORY_TRACT | Status: DC

## 2015-07-31 MED ORDER — BUDESONIDE 0.5 MG/2ML IN SUSP
0.5000 mg | Freq: Two times a day (BID) | RESPIRATORY_TRACT | Status: DC
  Administered 2015-07-31 – 2015-08-01 (×2): 0.5 mg via RESPIRATORY_TRACT
  Filled 2015-07-31 (×2): qty 2

## 2015-07-31 MED ORDER — SCOPOLAMINE 1 MG/3DAYS TD PT72
1.0000 | MEDICATED_PATCH | TRANSDERMAL | Status: DC
  Administered 2015-07-31: 13:00:00 1.5 mg via TRANSDERMAL
  Filled 2015-07-31: qty 1

## 2015-07-31 NOTE — Care Management Important Message (Signed)
Important Message  Patient Details  Name: [name] MRN: [id number] Date of Birth: 11/05/1951   Medicare Important Message Given:   (1st IM)    Gwenette Greet 07/31/2015, 12:35 PM

## 2015-07-31 NOTE — Plan of Care (Signed)
Problem: Discharge Progression Outcomes Goal: Other Discharge Outcomes/Goals Outcome: Progressing Plan of care progress to goal: Patient admitted with aspirational pneumonia, congested, crackles heard audible.  Tube feeding infusing at 53cc/hr, very little residual noted this shift. Blood sugars every 2 hours and remain stable Patient has had one large loose stool Remains on contact precautions for Mrsa Patient on iv antibiotics. V/s stable.

## 2015-07-31 NOTE — Progress Notes (Signed)
SUBJECTIVE: Patient is not alert and oriented and is unable to give any history.   Filed Vitals:   07/31/15 0500 07/31/15 0523 07/31/15 0819 07/31/15 0946  BP:  100/69  120/80  Pulse:  91  93  Temp:  97.7 F (36.5 C)  97.3 F (36.3 C)  TempSrc:  Oral  Oral  Resp:  24    Height:      Weight: 59.24 kg (130 lb 9.6 oz)     SpO2:  92% 90% 100%    Intake/Output Summary (Last 24 hours) at 07/31/15 1018 Last data filed at 07/30/15 1600  Gross per 24 hour  Intake   1851 ml  Output      0 ml  Net   1851 ml    LABS: Basic Metabolic Panel:  Recent Labs  16/10/96 0748 07/31/15 0400  NA 141 145  K 4.7 5.1  CL 105 107  CO2 29 31  GLUCOSE 86 155*  BUN 32* 36*  CREATININE 1.92* 2.20*  CALCIUM 8.3* 8.5*  MG 2.4  --   PHOS 3.8  --    Liver Function Tests: No results for input(s): AST, ALT, ALKPHOS, BILITOT, PROT, ALBUMIN in the last 72 hours. No results for input(s): LIPASE, AMYLASE in the last 72 hours. CBC:  Recent Labs  07/29/15 0748 07/31/15 0400  WBC 8.0 8.9  HGB 11.8* 11.6*  HCT 36.3* 35.6*  MCV 85.7 86.4  PLT 252 265   Cardiac Enzymes:  Recent Labs  07/28/15 1521 07/28/15 2317 07/29/15 0748  TROPONINI <0.03 <0.03 <0.03   BNP: Invalid input(s): POCBNP D-Dimer: No results for input(s): DDIMER in the last 72 hours. Hemoglobin A1C: No results for input(s): HGBA1C in the last 72 hours. Fasting Lipid Panel: No results for input(s): CHOL, HDL, LDLCALC, TRIG, CHOLHDL, LDLDIRECT in the last 72 hours. Thyroid Function Tests: No results for input(s): TSH, T4TOTAL, T3FREE, THYROIDAB in the last 72 hours.  Invalid input(s): FREET3 Anemia Panel: No results for input(s): VITAMINB12, FOLATE, FERRITIN, TIBC, IRON, RETICCTPCT in the last 72 hours.   PHYSICAL EXAM General: Well developed, well nourished, in no acute distress HEENT:  Normocephalic and atramatic Neck:  No JVD.  Lungs: Clear bilaterally to auscultation and percussion. Heart: HRRR . Normal S1 and  S2 without gallops or murmurs.  Abdomen: Bowel sounds are positive, abdomen soft and non-tender  Msk:  Back normal, normal gait. Normal strength and tone for age. Extremities: No clubbing, cyanosis or edema.   Neuro: Alert and oriented X 3. Psych:  Good affect, responds appropria the top tely  TELEMETRY: Sinus rhythm  ASSESSMENT AND PLAN: History of CVA, with heart or mental status and elevated troponin. Advise medical treatment.  Active Problems:   Acute respiratory failure   Pressure ulcer    Seab Axel A, MD, [facility] 07/31/2015 10:18 AM

## 2015-07-31 NOTE — Progress Notes (Signed)
Patient did not tolerate CPT  

## 2015-07-31 NOTE — Progress Notes (Signed)
Patient ID: [name], male   DOB: August 17, 1951, 64 y.o.   MRN: [id number]  [facility] PROGRESS NOTE  PCP: Manus Rudd LEE, DO  HPI/Subjective: Patient more alert today and tries to answer question. Family at bedside. Still audible rhonchi heard up her airway even without stethoscope.  Objective: Filed Vitals:   07/31/15 0946  BP: 120/80  Pulse: 93  Temp: 97.3 F (36.3 C)  Resp: 20    Filed Weights   07/28/15 0455 07/30/15 0500 07/31/15 0500  Weight: 61.9 kg (136 lb 7.4 oz) 60.555 kg (133 lb 8 oz) 59.24 kg (130 lb 9.6 oz)    ROS: Review of Systems  Unable to perform ROS  secondary to mental status. Exam: Physical Exam  HENT:  Nose: No mucosal edema.  Mouth/Throat: No oropharyngeal exudate or posterior oropharyngeal edema.  Eyes: Lids are normal.  Right eye clouded over. Left eye reactive to light  Neck: No JVD present. Carotid bruit is not present. No edema present. No thyroid mass and no thyromegaly present.  Cardiovascular: Regular rhythm, S1 normal and S2 normal.  Exam reveals no gallop.   No murmur heard. Pulses:      Dorsalis pedis pulses are 1+ on the right side, and 1+ on the left side.  Respiratory: No respiratory distress. He has no wheezes. He has rhonchi in the right middle field, the right lower field, the left middle field and the left lower field. He has no rales.  Upper airway congestion.  GI: Soft. Bowel sounds are normal. There is no tenderness.  Abdomen wet with tube feeds. Connection little bit loose.  Musculoskeletal:       Right ankle: He exhibits no swelling.       Left ankle: He exhibits no swelling.  Lymphadenopathy:    He has no cervical adenopathy.  Neurological: He is alert.  Turns his head on his own. Arms contracted  Skin: Skin is warm. No rash noted. Nails show no clubbing.  Hands and feet cold.  Psychiatric:  More alert today.    Data Reviewed: Basic Metabolic Panel:  Recent Labs Lab 07/26/15 0050  07/27/15 0727 07/28/15 0320 07/29/15 0748 07/31/15 0400  NA 127* 142 141 141 145  K 4.5 4.3 4.6 4.7 5.1  CL 91* 106 104 105 107  CO2 26 29 29 29 31   GLUCOSE 310* 72 79 86 155*  BUN 46* 45* 36* 32* 36*  CREATININE 2.44* 2.43* 2.23* 1.92* 2.20*  CALCIUM 7.6* 8.2* 8.1* 8.3* 8.5*  MG  --   --  2.5* 2.4  --   PHOS  --   --  4.0 3.8  --    Liver Function Tests:  Recent Labs Lab 07/26/15 0050  AST 22  ALT 12*  ALKPHOS 85  BILITOT <0.1*  PROT 6.2*  ALBUMIN 1.9*   CBC:  Recent Labs Lab 07/26/15 0050 07/27/15 0727 07/28/15 0320 07/29/15 0748 07/31/15 0400  WBC 13.9* 13.1* 9.1 8.0 8.9  HGB 10.2* 10.6* 10.4* 11.8* 11.6*  HCT 31.3* 33.6* 32.2* 36.3* 35.6*  MCV 84.1 85.7 85.8 85.7 86.4  PLT 223 217 233 252 265   Studies: Dg Chest Port 1 View  07/30/2015   CLINICAL DATA:  Increasing congestion.  EXAM: PORTABLE CHEST - 1 VIEW  COMPARISON:  07/28/2015  FINDINGS: Mild basilar airspace opacities are present, worsened. This may be atelectatic or infectious. No large effusion is evident.  IMPRESSION: Worsening basilar atelectasis or infiltrate.   Electronically Signed   By: Reuel Boom  Royce Macadamia M.D.   On: 07/30/2015 06:14    Scheduled Meds: . antiseptic oral rinse  7 mL Mouth Rinse BID  . atorvastatin  40 mg Per Tube Daily  . budesonide (PULMICORT) nebulizer solution  0.25 mg Nebulization BID  . cholecalciferol  2,000 Units Oral Daily  . clindamycin (CLEOCIN) IV  600 mg Intravenous 3 times per day  . clopidogrel  75 mg Per Tube Daily  . heparin  5,000 Units Subcutaneous 3 times per day  . ipratropium-albuterol  3 mL Nebulization 3 times per day  . latanoprost  1 drop Left Eye QHS  . levETIRAcetam  1,000 mg Per Tube BID  . levothyroxine  150 mcg Per Tube QAC breakfast  . metoprolol tartrate  25 mg Per Tube BID  . scopolamine  1 patch Transdermal Q72H  . sodium chloride  3 mL Intravenous Q12H  . Valproic Acid  750 mg Per Tube q morning - 10a   And  . Valproic Acid  1,000 mg Per  Tube QHS   Continuous Infusions: . feeding supplement (JEVITY 1.5 CAL/FIBER) 1,000 mL (07/30/15 1805)  . free water      Assessment/Plan:  1. aspiration pneumonia - continue clindamycin. And scopolamine patch and budesonide nebulized 2. Hypoglycemia - stopped insulin coverage. So far sugars have been good.  3. Acute respiratory failure with hypoxia. Patient was intubated and then extubated the next day. Increased respiratory rate possible with aspiration continue to monitor. 4. Acute renal failure on chronic kidney disease stage III. Hydration just with PEG. 5. History of seizure on Keppra and valproic acid 6. History of stroke- on Plavix 7. Hypothyroidism unspecified on levothyroxin 8. Essential hypertension- continue metoprolol 9. Malnutrition- patient with a very low albumen. Patient at goal rate with feeding.  Code Status:     Code Status Orders        Start     Ordered   07/26/15 0506  Full code   Continuous     07/26/15 0505     Family Communication: Family at bedside. Disposition Plan: Back to [facility] when stable. Potentially tomorrow.  Time spent: 25 minutes  Alford Highland  [facility]

## 2015-07-31 NOTE — Plan of Care (Signed)
Problem: Discharge Progression Outcomes Goal: Other Discharge Outcomes/Goals Outcome: Progressing PT admitted with aspirational pneumonia Pt has PEG tube with feeding going at 53, residuals this shift of 30cc, 36cc and 80 cc BS Q2 hours and remain stable Pt remains on contact precautions for Mrsa Patient continues on IV ABX,  VSS Sisters at bedside Possible d/c back to [facility] tomorrow

## 2015-07-31 NOTE — Progress Notes (Signed)
Nutrition Follow-up   INTERVENTION:   EN: Recommend continuing TF at goal rate as ordered as pt tolerating well, with minimal residuals and +BMs. Will continue to monitor Na and UOP and adjust free water as needed.   NUTRITION DIAGNOSIS:   Inadequate oral intake related to acute illness as evidenced by NPO status. Being addressed with TF.  GOAL:   Provide needs based on ASPEN/SCCM guidelines  MONITOR:    (Energy Intake, Anthropometrics, Digestive System, Electrolyte/Renal Profile, Glucose Profile)   ASSESSMENT:    Pt tolerating TF over the weekend. Per Nsg documentation pt TF held Sunday secondary to dislodged connector to PEG and residual of 60mL.  EN: Tolerating Jevity 1.5 TF at 25mL/hr with free water flushes of 59mL/hr  Gastrointestinal Profile: WDL per Nsg Last BM:  07/30/2015   Electrolyte/Renal Profile and Glucose Profile:   Recent Labs Lab 07/28/15 0320 07/29/15 0748 07/31/15 0400  NA 141 141 145  K 4.6 4.7 5.1  CL 104 105 107  CO2 BUN 36* 32* 36*  CREATININE 2.23* 1.92* 2.20*  CALCIUM 8.1* 8.3* 8.5*  MG 2.5* 2.4  --   PHOS 4.0 3.8  --   GLUCOSE 79 86 155*   Protein Profile:  Recent Labs Lab 07/26/15 0050  ALBUMIN 1.9*     Weight Trend since Admission: Filed Weights   07/28/15 0455 07/30/15 0500 07/31/15 0500  Weight: 136 lb 7.4 oz (61.9 kg) 133 lb 8 oz (60.555 kg) 130 lb 9.6 oz (59.24 kg)     Skin:   (stage II coccyx)  BMI:  Body mass index is 18.74 kg/(m^2).  Estimated Nutritional Needs:   Kcal:  1901-2246 kcals (BEE 1440, 1.2 AF, 1.1-1.3 IF)   Protein:  71-89 g (1.2-1.5 g/kg)   Fluid:  1475-1770 mL (25-30 ml/kg)    HIGH Care Level  Leda Quail, RD, LDN Pager [phone]

## 2015-08-01 LAB — GLUCOSE, CAPILLARY
Glucose-Capillary: 85 mg/dL (ref 65–99)
Glucose-Capillary: 171 mg/dL — ABNORMAL HIGH (ref 65–99)

## 2015-08-01 MED ORDER — BUDESONIDE 0.5 MG/2ML IN SUSP: 0.5000 mg | Freq: Two times a day (BID) | RESPIRATORY_TRACT | Status: AC

## 2015-08-01 MED ORDER — IPRATROPIUM-ALBUTEROL 0.5-2.5 (3) MG/3ML IN SOLN: 3.0000 mL | Freq: Four times a day (QID) | RESPIRATORY_TRACT | Status: AC

## 2015-08-01 MED ORDER — CLINDAMYCIN HCL 150 MG PO CAPS
300.0000 mg | ORAL_CAPSULE | Freq: Three times a day (TID) | ORAL | Status: DC
  Administered 2015-08-01 (×2): 300 mg via ORAL
  Filled 2015-08-01 (×2): qty 2

## 2015-08-01 MED ORDER — FREE WATER: 30.0000 mL | Status: AC

## 2015-08-01 MED ORDER — CLINDAMYCIN HCL 300 MG PO CAPS: 300.0000 mg | ORAL_CAPSULE | Freq: Three times a day (TID) | ORAL | Status: AC

## 2015-08-01 MED ORDER — JEVITY 1.5 CAL/FIBER PO LIQD: 1000.0000 mL | ORAL | Status: AC

## 2015-08-01 MED ORDER — SCOPOLAMINE 1 MG/3DAYS TD PT72: 1.0000 | MEDICATED_PATCH | TRANSDERMAL | Status: AC

## 2015-08-01 NOTE — Clinical Social Work Note (Signed)
Pt is ready for discharge today to [facility]. Pt's family is agreeable to discharge plan. RN called report and EMS will provide transportation. CSW is signing off as no further needs identified.   Dede Query, MSW, LCSW Clinical Social Worker  [phone]

## 2015-08-01 NOTE — Plan of Care (Signed)
Problem: Discharge Progression Outcomes Goal: Other Discharge Outcomes/Goals Outcome: Progressing Plan of care progress to goal: Patient admitted on 07/26/2015 with aspirational pneumonia, patient is confused, total care                                                  Patient is npo, Jevity 1.5 infusing thru peg with Kangaroo pump at 53cc/hr, residual was over 100 cc at 2300, nurse held feeding due to patient with increased                                                  Congestion, lungs with crackles noted, restarted jevity at 53cc/hr as ordered.                                                  Patient v/s stable, blood sugars stable thru out shift, He is afebrile.                                                  Patient is incontinent of urine and stool.                                                   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

## 2015-08-01 NOTE — Discharge Summary (Signed)
[facility] - [place] at [facility]   PATIENT NAME: [name]    MR#:  [id number]  DATE OF BIRTH:  [birth year]  DATE OF ADMISSION:  07/26/2015 ADMITTING PHYSICIAN: Arnaldo Natal, MD  DATE OF DISCHARGE: 08/01/2015  PRIMARY CARE PHYSICIAN: FISCHER, TIMOTHY LEE, DO    ADMISSION DIAGNOSIS:  Aspiration pneumonia, unspecified aspiration pneumonia type [J69.0] Altered mental status, unspecified altered mental status type [R41.82]  DISCHARGE DIAGNOSIS:  Active Problems:   Acute respiratory failure   Pressure ulcer   SECONDARY DIAGNOSIS:   Past Medical History  Diagnosis Date  . CVA (cerebral vascular accident)   . COPD (chronic obstructive pulmonary disease)   . Hemiplegia   . Dysphagia   . Gastrostomy in place   . Muscle weakness   . Urinary tract infection   . Hyperkalemia   . Dementia     HOSPITAL COURSE:   1. Patient was admitted with clinical sepsis and suspected pneumonia and was started on aggressive antibiotics. 2 chest x-rays were negative and antibiotics were stopped. I think the patient had an aspiration event and then clindamycin was started. This will be continued until completion. The patient will need suctioning quite often. I did start DuoNeb nebulizer solution and budesonide to try to break down inflammation in the lungs. Upon discharge she does have some rhonchi in the lungs. 2. Hypoglycemic episodes when tube feeding was stopped. The patient is a borderline diabetic with hemoglobin A1c of 6.9. I'd rather have his sugars on the higher side so I'm not even get a treat this. No need for sliding scale either. 3. Acute respiratory failure with hypoxia. The patient was intubated and then extubated quickly and has remained off oxygen so this has resolved. 4. Acute renal failure on chronic kidney disease stage III. Hydration must be maintained via the PEG free water. 5. History of CVA continue usual medications. 6. History of seizure  continue usual medications. 7. Malnutrition- dietary specialist recommended continuous feeding while here in the hospital and upon discharge the facility may decide to switch things back to bolus feeding. Patient must be hydrated via the PEG if this is chosen. 8. Hyperlipidemia unspecified continue atorvastatin.  DISCHARGE CONDITIONS:   Fair  CONSULTS OBTAINED:  Treatment Team:  Merwyn Katos, MD Laurier Nancy, MD Christena Deem, MD  DRUG ALLERGIES:  No Known Allergies  DISCHARGE MEDICATIONS:   Current Discharge Medication List    START taking these medications   Details  budesonide (PULMICORT) 0.5 MG/2ML nebulizer solution Take 2 mLs (0.5 mg total) by nebulization 2 (two) times daily. Qty: 56 mL, Refills: 0    clindamycin (CLEOCIN) 300 MG capsule Place 1 capsule (300 mg total) into feeding tube every 8 (eight) hours. Qty: 30 capsule, Refills: 0    Nutritional Supplements (FEEDING SUPPLEMENT, JEVITY 1.5 CAL/FIBER,) LIQD Place 1,000 mLs into feeding tube continuous. Qty: 1000 mL, Refills: 7    scopolamine (TRANSDERM-SCOP) 1 MG/3DAYS Place 1 patch (1.5 mg total) onto the skin every 3 (three) days. Qty: 10 patch, Refills: 12    Water For Irrigation, Sterile (FREE WATER) SOLN Place 30 mLs into feeding tube continuous. Qty: 1000 mL, Refills: 3      CONTINUE these medications which have CHANGED   Details  ipratropium-albuterol (DUONEB) 0.5-2.5 (3) MG/3ML SOLN Take 3 mLs by nebulization every 6 (six) hours. Qty: 360 mL, Refills: 0      CONTINUE these medications which have NOT CHANGED   Details  acetaminophen (TYLENOL) 650  MG suppository Place 650 mg rectally every 8 (eight) hours as needed.    atorvastatin (LIPITOR) 40 MG tablet 40 mg by Feeding Tube route daily.    bisacodyl (DULCOLAX) 10 MG suppository Place 10 mg rectally as needed for moderate constipation.    Cholecalciferol (VITAMIN D3) LIQD 2,000 Units by Feeding Tube route daily.    clopidogrel (PLAVIX)  75 MG tablet 75 mg by Feeding Tube route daily.    latanoprost (XALATAN) 0.005 % ophthalmic solution Place 1 drop into the left eye at bedtime.    levETIRAcetam (KEPPRA) 100 MG/ML solution Take 1,000 mg by mouth 2 (two) times daily.    levothyroxine (SYNTHROID, LEVOTHROID) 150 MCG tablet 150 mcg by Feeding Tube route daily.    metoprolol tartrate (LOPRESSOR) 25 MG tablet 25 mg by Feeding Tube route 2 (two) times daily.    phenylephrine-shark liver oil-mineral oil-petrolatum (PREPARATION H) 0.25-3-14-71.9 % rectal ointment Place 1 application rectally 2 (two) times daily as needed for hemorrhoids.    polyethylene glycol (MIRALAX / GLYCOLAX) packet Take 17 g by mouth daily. Mix in 4-8 oz of fluid    valproate (DEPAKENE) 250 MG/5ML syrup Take 750-1,000 mg by mouth 2 (two) times daily. 750mg  am, 1000mg  bedtime      STOP taking these medications     cefTRIAXone (ROCEPHIN) 1 G injection          DISCHARGE INSTRUCTIONS:   Follow-up with Dr. at facility in 1-2 days.  If you experience worsening of your admission symptoms, develop shortness of breath, life threatening emergency, suicidal or homicidal thoughts you must seek medical attention immediately by calling 911 or calling your MD immediately  if symptoms less severe.  You Must read complete instructions/literature along with all the possible adverse reactions/side effects for all the Medicines you take and that have been prescribed to you. Take any new Medicines after you have completely understood and accept all the possible adverse reactions/side effects.   Please note  You were cared for by a hospitalist during your hospital stay. If you have any questions about your discharge medications or the care you received while you were in the hospital after you are discharged, you can call the unit and asked to speak with the hospitalist on call if the hospitalist that took care of you is not available. Once you are discharged, your  primary care physician will handle any further medical issues. Please note that NO REFILLS for any discharge medications will be authorized once you are discharged, as it is imperative that you return to your primary care physician (or establish a relationship with a primary care physician if you do not have one) for your aftercare needs so that they can reassess your need for medications and monitor your lab values.    Today   CHIEF COMPLAINT:   Chief Complaint  Patient presents with  . Altered Mental Status    HISTORY OF PRESENT ILLNESS:  [name]  is a 64 y.o. male with a known history of CVA and seizure was sent in with altered mental status. He was admitted with suspected sepsis.   VITAL SIGNS:  Blood pressure 120/71, pulse 91, temperature 97.3 F (36.3 C), temperature source Oral, resp. rate 20, height 5\' 10"  (1.778 m), weight 57.516 kg (126 lb 12.8 oz), SpO2 92 %.  I/O:   Intake/Output Summary (Last 24 hours) at 08/01/15 0937 Last data filed at 08/01/15 0600  Gross per 24 hour  Intake    683 ml  Output  0 ml  Net    683 ml    PHYSICAL EXAMINATION:  GENERAL:  64 y.o.-year-old patient lying in the bed with no acute distress.  EYES: Pupils equal, round, reactive to light and accommodation. No scleral icterus. Extraocular muscles intact.  HEENT: Head atraumatic, normocephalic. Oropharynx and nasopharynx clear.  NECK:  Supple, no jugular venous distention. No thyroid enlargement, no tenderness.  LUNGS: Normal breath sounds bilaterally, no wheezing, rhonchi in right lung greater than left. No use of accessory muscles of respiration. Upper airway congestion requiring suctioning frequently. CARDIOVASCULAR: S1, S2 normal. No murmurs, rubs, or gallops.  ABDOMEN: Soft, non-tender, non-distended. Bowel sounds present. No organomegaly or mass.  EXTREMITIES: No pedal edema, cyanosis, or clubbing.  NEUROLOGIC: Patient moves his head on his own and his arms are  contracted. PSYCHIATRIC: The patient is alert. SKIN: Chronic lower extremity discoloration.  DATA REVIEW:   CBC  Recent Labs Lab 07/31/15 0400  WBC 8.9  HGB 11.6*  HCT 35.6*  PLT 265    Chemistries   Recent Labs Lab 07/26/15 0050  07/29/15 0748 07/31/15 0400  NA 127*  < > 141 145  K 4.5  < > 4.7 5.1  CL 91*  < > 105 107  CO2 26  < > 29 31  GLUCOSE 310*  < > 86 155*  BUN 46*  < > 32* 36*  CREATININE 2.44*  < > 1.92* 2.20*  CALCIUM 7.6*  < > 8.3* 8.5*  MG  --   < > 2.4  --   AST 22  --   --   --   ALT 12*  --   --   --   ALKPHOS 85  --   --   --   BILITOT <0.1*  --   --   --   < > = values in this interval not displayed.  Cardiac Enzymes  Recent Labs Lab 07/29/15 0748  TROPONINI <0.03    Microbiology Results  Results for orders placed or performed during the hospital encounter of 07/26/15  Culture, blood (routine x 2)     Status: None   Collection Time: 07/26/15 12:50 AM  Result Value Ref Range Status   Specimen Description BLOOD RIGHT ASSIST CONTROL  Final   Special Requests BOTTLES DRAWN AEROBIC AND ANAEROBIC 5CC  Final   Culture NO GROWTH 5 DAYS  Final   Report Status 07/31/2015 FINAL  Final  Culture, blood (routine x 2)     Status: None   Collection Time: 07/26/15  2:22 AM  Result Value Ref Range Status   Specimen Description BLOOD BLOOD RIGHT FOREARM  Final   Special Requests   Final    BOTTLES DRAWN AEROBIC AND ANAEROBIC 1CCANAEROBIC, 7CCAEROBIC   Culture NO GROWTH 5 DAYS  Final   Report Status 07/31/2015 FINAL  Final  MRSA PCR Screening     Status: Abnormal   Collection Time: 07/26/15  5:54 AM  Result Value Ref Range Status   MRSA by PCR POSITIVE (A) NEGATIVE Final    Comment:        The GeneXpert MRSA Assay (FDA approved for NASAL specimens only), is one component of a comprehensive MRSA colonization surveillance program. It is not intended to diagnose MRSA infection nor to guide or monitor treatment for MRSA infections. CRITICAL  RESULT CALLED TO, READ BACK BY AND VERIFIED WITH: JOHANNA LINDSEY 07/26/15 0840 BOD   Urine culture     Status: None   Collection Time: 07/26/15  8:00  AM  Result Value Ref Range Status   Specimen Description URINE, RANDOM  Final   Special Requests Normal  Final   Culture INSIGNIFICANT GROWTH  Final   Report Status 07/28/2015 FINAL  Final  C difficile quick scan w PCR reflex     Status: None   Collection Time: 07/28/15  8:17 AM  Result Value Ref Range Status   C Diff antigen NEGATIVE NEGATIVE Final   C Diff toxin NEGATIVE NEGATIVE Final   C Diff interpretation Negative for C. difficile  Final   Management plans discussed with the patient. I left message for daughter today. Social work will have to contact.   CODE STATUS:     Code Status Orders        Start     Ordered   07/26/15 0506  Full code   Continuous     07/26/15 0505      TOTAL TIME TAKING CARE OF THIS PATIENT: 35  minutes.    Alford Highland M.D on 08/01/2015 at 9:37 AM  Between 7am to 6pm - Pager - [phone]  After 6pm go to [email or web address] - password [email or web address] [facility]  [place] [facility]  Office  [phone]  CC: Primary care physician; Manus Rudd LEE, DO

## 2015-08-01 NOTE — Discharge Instructions (Signed)

## 2015-08-01 NOTE — Progress Notes (Signed)
   SUBJECTIVE: Unable to provide ROS   Filed Vitals:   07/31/15 1410 07/31/15 1946 07/31/15 2200 08/01/15 0445  BP:   122/64 120/71  Pulse:   93 91  Temp:   97.7 F (36.5 C) 97.3 F (36.3 C)  TempSrc:   Oral Oral  Resp:   20 20  Height:      Weight:    57.516 kg (126 lb 12.8 oz)  SpO2: 100% 95% 98% 92%    Intake/Output Summary (Last 24 hours) at 08/01/15 0851 Last data filed at 08/01/15 0600  Gross per 24 hour  Intake    683 ml  Output      0 ml  Net    683 ml    LABS: Basic Metabolic Panel:  Recent Labs  21/30/86 0400  NA 145  K 5.1  CL 107  CO2 31  GLUCOSE 155*  BUN 36*  CREATININE 2.20*  CALCIUM 8.5*   Liver Function Tests: No results for input(s): AST, ALT, ALKPHOS, BILITOT, PROT, ALBUMIN in the last 72 hours. No results for input(s): LIPASE, AMYLASE in the last 72 hours. CBC:  Recent Labs  07/31/15 0400  WBC 8.9  HGB 11.6*  HCT 35.6*  MCV 86.4  PLT 265   Cardiac Enzymes: No results for input(s): CKTOTAL, CKMB, CKMBINDEX, TROPONINI in the last 72 hours. BNP: Invalid input(s): POCBNP D-Dimer: No results for input(s): DDIMER in the last 72 hours. Hemoglobin A1C: No results for input(s): HGBA1C in the last 72 hours. Fasting Lipid Panel: No results for input(s): CHOL, HDL, LDLCALC, TRIG, CHOLHDL, LDLDIRECT in the last 72 hours. Thyroid Function Tests: No results for input(s): TSH, T4TOTAL, T3FREE, THYROIDAB in the last 72 hours.  Invalid input(s): FREET3 Anemia Panel: No results for input(s): VITAMINB12, FOLATE, FERRITIN, TIBC, IRON, RETICCTPCT in the last 72 hours.   PHYSICAL EXAM General: Appears chronically ill HEENT:  Normocephalic and atramatic Neck:  No JVD.  Lungs: Clear bilaterally to auscultation and percussion. Heart: HRRR . Normal S1 and S2 without gallops or murmurs.  Abdomen: Bowel sounds are positive, abdomen soft and non-tender  Msk:  Back normal, normal gait. Normal strength and tone for age. Extremities: No clubbing,  cyanosis or edema.      ASSESSMENT AND PLAN: Elevated troponin/history of CVA and altered mental status. Patient is not a candidate for revascularization. Continue medical management.   Patient and plan discussed with supervising provider, Dr. Adrian Blackwater, who agrees with above findings.   Alinda Sierras Margarito Courser [facility]  08/01/2015 8:51 AM

## 2015-08-01 NOTE — Progress Notes (Signed)
Patient continued on tube feeding this shift and was tolerating it well VSS, except from temp of 100 late in afternoon which came down to 99 with prn Tylenol suppository Patient started on ABX via PEG tube today  Report called to RN at [facility]  Patient discharged via EMS in stretcher

## 2015-08-01 NOTE — Progress Notes (Signed)
Spoke with May, [facility] rep at [phone], to notify of non-emergent EMS transport.  Auth notification reference given as [id number].   Service date range good from 08/01/15 - 10/30/15.   Gap exception requested to determine if services can be considered at an in-network level.

## 2015-08-24 DEATH — deceased

## 2016-04-26 IMAGING — CR DG CHEST 1V PORT
1 series · 1 of 1 positions shown · non-contrast
Comparison: Chest radiograph performed 04/12/2015

CLINICAL DATA: Acute onset of cough and chest rattling. Altered
mental status. Initial encounter.

EXAM:
PORTABLE CHEST - 1 VIEW

[portable]
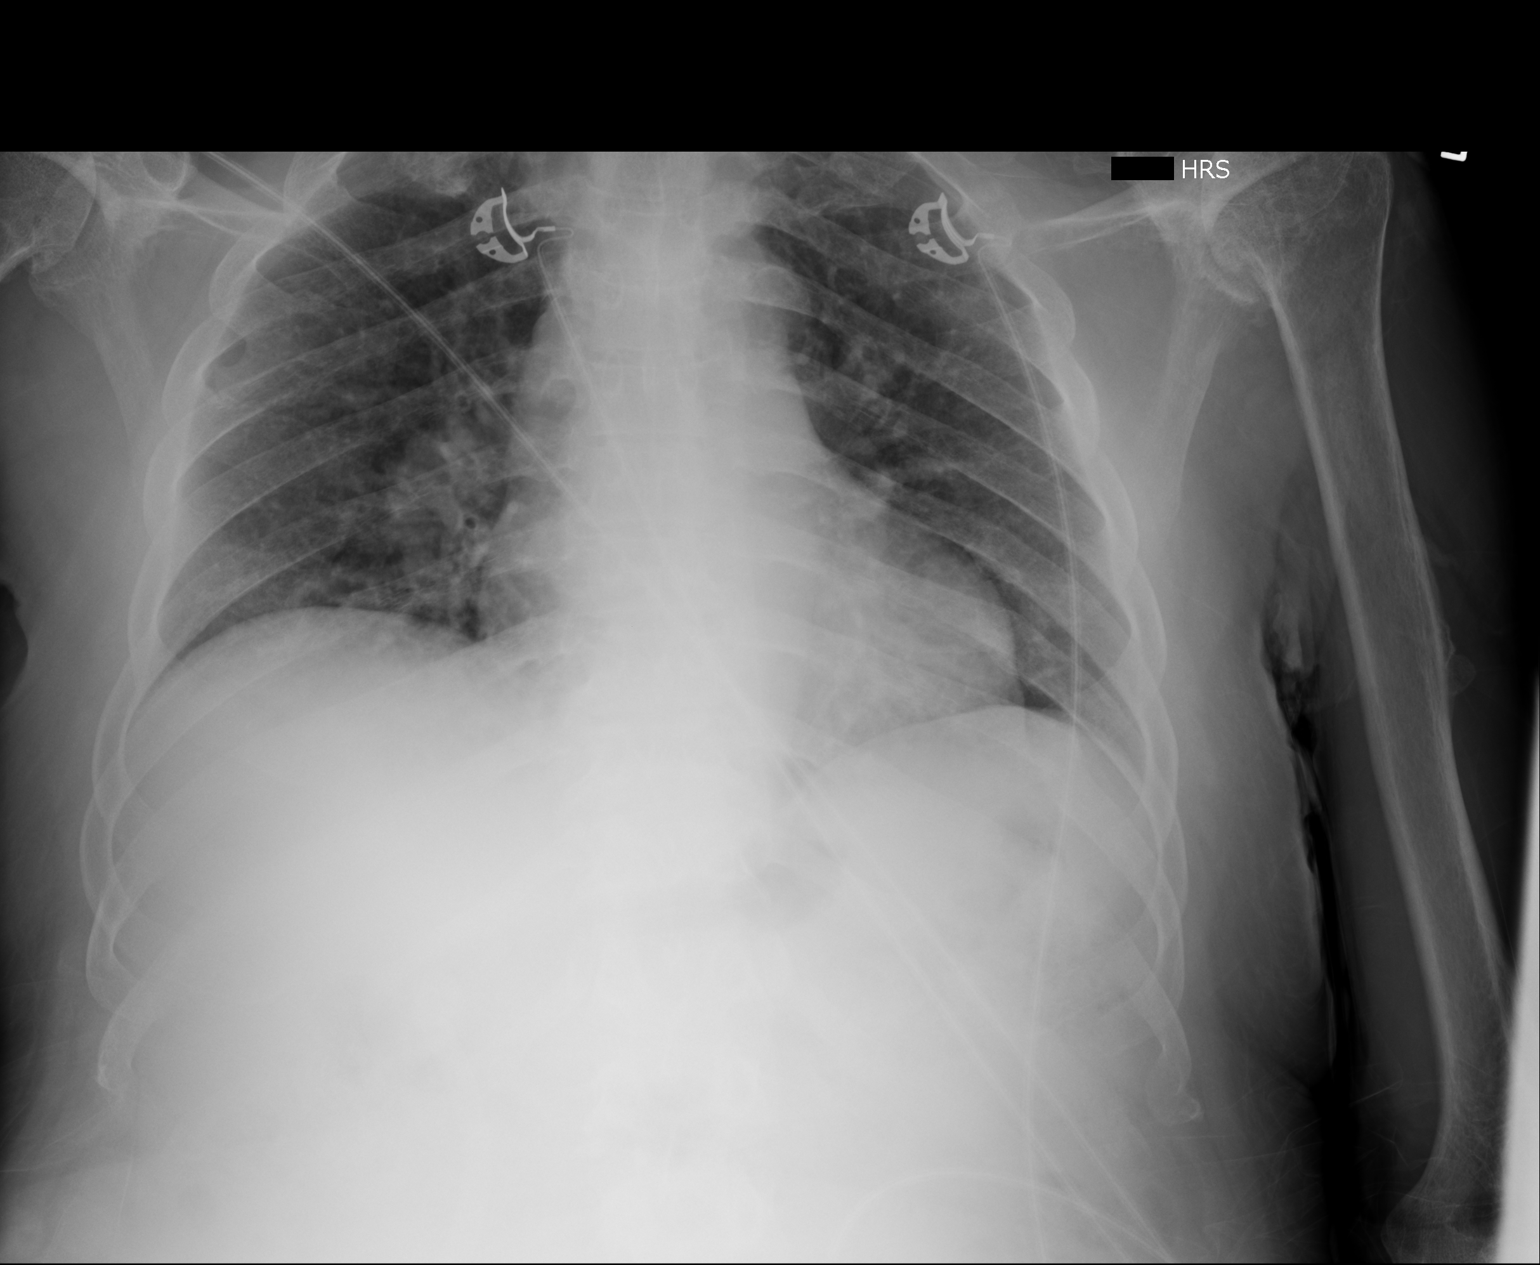

[1 of 1 positions shown; findings below may reference images not displayed]

FINDINGS: The lungs are well-aerated. Mild peribronchial thickening is noted.
There is no evidence of focal opacification, pleural effusion or
pneumothorax.

The cardiomediastinal silhouette is borderline normal in size. No
acute osseous abnormalities are seen. Mild degenerative change is
noted at the glenohumeral joints bilaterally.
IMPRESSION: Mild peribronchial thickening noted; lungs otherwise clear.

## 2016-04-26 IMAGING — CR DG CHEST 1V PORT
1 series · 1 of 1 positions shown · non-contrast
Comparison: 07/26/2015

CLINICAL DATA: Endotracheal intubation.

EXAM:
PORTABLE CHEST - 1 VIEW

[portable]
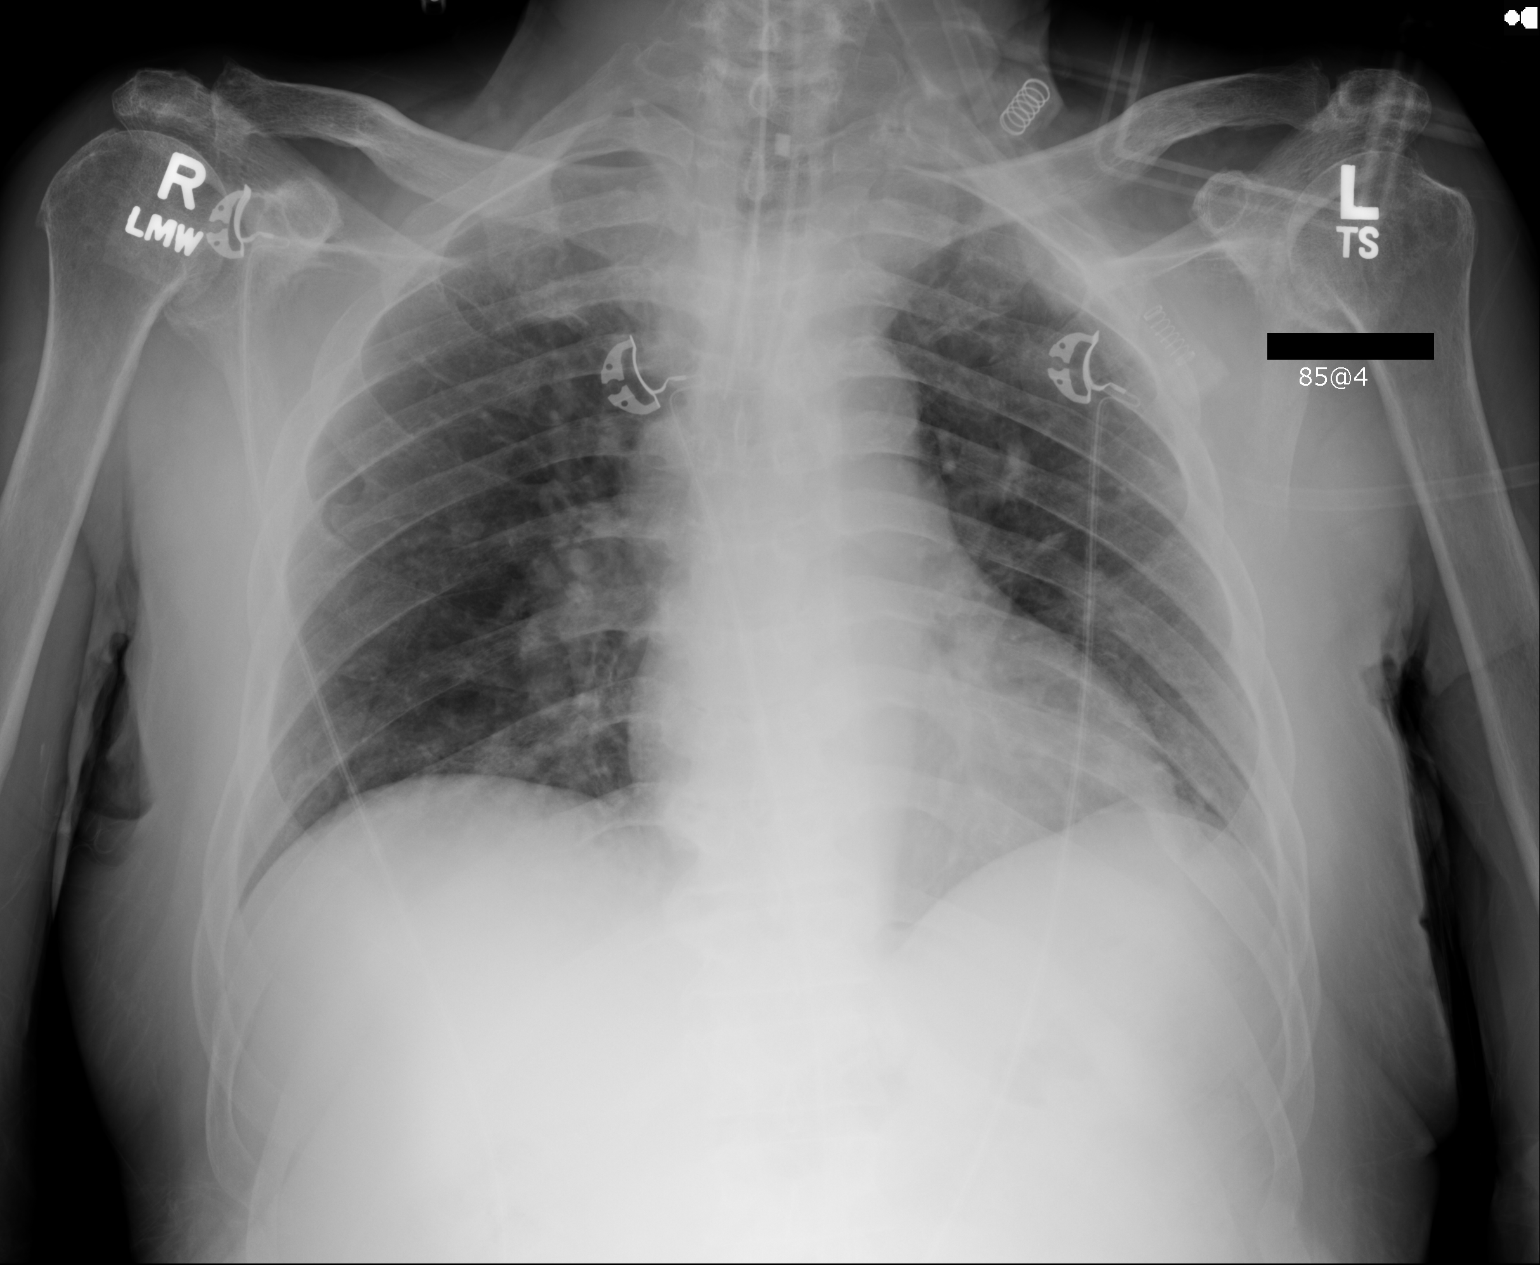

[1 of 1 positions shown; findings below may reference images not displayed]

FINDINGS: The endotracheal tube is 3.1 cm above the carina. There is no
pneumothorax. There is no large effusion. Heart size is normal.
There is no confluent airspace consolidation.
IMPRESSION: Satisfactory ET tube placement.
